# Patient Record
Sex: Female | Born: 1975 | Race: White | Hispanic: No | Smoking: Current every day smoker
Health system: Southern US, Community
[De-identification: ages and names within clinical notes are randomized; demographics above are authoritative.]

## PROBLEM LIST (undated history)

## (undated) DIAGNOSIS — F32A Depression, unspecified: Secondary | ICD-10-CM

## (undated) DIAGNOSIS — R12 Heartburn: Secondary | ICD-10-CM

## (undated) DIAGNOSIS — G4730 Sleep apnea, unspecified: Secondary | ICD-10-CM

## (undated) DIAGNOSIS — R7303 Prediabetes: Secondary | ICD-10-CM

## (undated) DIAGNOSIS — Z22322 Carrier or suspected carrier of Methicillin resistant Staphylococcus aureus: Secondary | ICD-10-CM

## (undated) DIAGNOSIS — F988 Other specified behavioral and emotional disorders with onset usually occurring in childhood and adolescence: Secondary | ICD-10-CM

## (undated) DIAGNOSIS — R51 Headache: Secondary | ICD-10-CM

## (undated) DIAGNOSIS — E559 Vitamin D deficiency, unspecified: Secondary | ICD-10-CM

## (undated) DIAGNOSIS — F419 Anxiety disorder, unspecified: Secondary | ICD-10-CM

## (undated) DIAGNOSIS — E669 Obesity, unspecified: Secondary | ICD-10-CM

## (undated) DIAGNOSIS — F329 Major depressive disorder, single episode, unspecified: Secondary | ICD-10-CM

## (undated) HISTORY — DX: Anxiety disorder, unspecified: F41.9

## (undated) HISTORY — DX: Sleep apnea, unspecified: G47.30

## (undated) HISTORY — PX: OTHER SURGICAL HISTORY: SHX169

## (undated) HISTORY — PX: TUBAL LIGATION: SHX77

## (undated) HISTORY — PX: WISDOM TOOTH EXTRACTION: SHX21

---

## 1997-07-04 ENCOUNTER — Emergency Department (HOSPITAL_COMMUNITY): Admission: EM | Admit: 1997-07-04 | Discharge: 1997-07-04 | Payer: Self-pay | Admitting: Emergency Medicine

## 1997-11-16 ENCOUNTER — Emergency Department (HOSPITAL_COMMUNITY): Admission: EM | Admit: 1997-11-16 | Discharge: 1997-11-16 | Payer: Self-pay | Admitting: Family Medicine

## 1998-08-30 ENCOUNTER — Emergency Department (HOSPITAL_COMMUNITY): Admission: EM | Admit: 1998-08-30 | Discharge: 1998-08-30 | Payer: Self-pay | Admitting: Emergency Medicine

## 1999-01-12 ENCOUNTER — Inpatient Hospital Stay (HOSPITAL_COMMUNITY): Admission: AD | Admit: 1999-01-12 | Discharge: 1999-01-12 | Payer: Self-pay | Admitting: Obstetrics & Gynecology

## 1999-01-12 ENCOUNTER — Encounter: Payer: Self-pay | Admitting: Obstetrics

## 1999-01-26 ENCOUNTER — Inpatient Hospital Stay (HOSPITAL_COMMUNITY): Admission: AD | Admit: 1999-01-26 | Discharge: 1999-01-26 | Payer: Self-pay | Admitting: Obstetrics

## 1999-02-03 ENCOUNTER — Encounter (HOSPITAL_COMMUNITY): Admission: RE | Admit: 1999-02-03 | Discharge: 1999-02-19 | Payer: Self-pay | Admitting: *Deleted

## 1999-02-17 ENCOUNTER — Encounter: Payer: Self-pay | Admitting: *Deleted

## 1999-02-17 ENCOUNTER — Inpatient Hospital Stay (HOSPITAL_COMMUNITY): Admission: AD | Admit: 1999-02-17 | Discharge: 1999-02-20 | Payer: Self-pay | Admitting: *Deleted

## 2000-04-21 ENCOUNTER — Emergency Department (HOSPITAL_COMMUNITY): Admission: EM | Admit: 2000-04-21 | Discharge: 2000-04-21 | Payer: Self-pay

## 2001-10-07 ENCOUNTER — Emergency Department (HOSPITAL_COMMUNITY): Admission: EM | Admit: 2001-10-07 | Discharge: 2001-10-07 | Payer: Self-pay | Admitting: Emergency Medicine

## 2001-11-03 ENCOUNTER — Emergency Department (HOSPITAL_COMMUNITY): Admission: EM | Admit: 2001-11-03 | Discharge: 2001-11-03 | Payer: Self-pay | Admitting: Emergency Medicine

## 2001-11-03 ENCOUNTER — Encounter: Payer: Self-pay | Admitting: Emergency Medicine

## 2002-06-21 ENCOUNTER — Inpatient Hospital Stay (HOSPITAL_COMMUNITY): Admission: AD | Admit: 2002-06-21 | Discharge: 2002-06-21 | Payer: Self-pay | Admitting: Family Medicine

## 2002-12-03 ENCOUNTER — Inpatient Hospital Stay (HOSPITAL_COMMUNITY): Admission: AD | Admit: 2002-12-03 | Discharge: 2002-12-03 | Payer: Self-pay | Admitting: Obstetrics & Gynecology

## 2003-03-18 ENCOUNTER — Emergency Department (HOSPITAL_COMMUNITY): Admission: AD | Admit: 2003-03-18 | Discharge: 2003-03-18 | Payer: Self-pay | Admitting: Family Medicine

## 2003-07-25 ENCOUNTER — Emergency Department (HOSPITAL_COMMUNITY): Admission: EM | Admit: 2003-07-25 | Discharge: 2003-07-25 | Payer: Self-pay | Admitting: Emergency Medicine

## 2003-09-06 ENCOUNTER — Emergency Department (HOSPITAL_COMMUNITY): Admission: EM | Admit: 2003-09-06 | Discharge: 2003-09-06 | Payer: Self-pay | Admitting: Emergency Medicine

## 2003-11-03 ENCOUNTER — Emergency Department (HOSPITAL_COMMUNITY): Admission: EM | Admit: 2003-11-03 | Discharge: 2003-11-03 | Payer: Self-pay | Admitting: Emergency Medicine

## 2004-08-28 ENCOUNTER — Inpatient Hospital Stay (HOSPITAL_COMMUNITY): Admission: AD | Admit: 2004-08-28 | Discharge: 2004-08-28 | Payer: Self-pay | Admitting: Obstetrics & Gynecology

## 2005-05-11 ENCOUNTER — Emergency Department (HOSPITAL_COMMUNITY): Admission: EM | Admit: 2005-05-11 | Discharge: 2005-05-11 | Payer: Self-pay | Admitting: Family Medicine

## 2005-05-31 ENCOUNTER — Inpatient Hospital Stay (HOSPITAL_COMMUNITY): Admission: AD | Admit: 2005-05-31 | Discharge: 2005-05-31 | Payer: Self-pay | Admitting: Family Medicine

## 2005-07-10 ENCOUNTER — Inpatient Hospital Stay (HOSPITAL_COMMUNITY): Admission: AD | Admit: 2005-07-10 | Discharge: 2005-07-10 | Payer: Self-pay | Admitting: Family Medicine

## 2005-07-21 ENCOUNTER — Encounter: Payer: Self-pay | Admitting: Emergency Medicine

## 2005-09-24 ENCOUNTER — Inpatient Hospital Stay (HOSPITAL_COMMUNITY): Admission: AD | Admit: 2005-09-24 | Discharge: 2005-09-24 | Payer: Self-pay | Admitting: Obstetrics & Gynecology

## 2005-11-21 ENCOUNTER — Inpatient Hospital Stay (HOSPITAL_COMMUNITY): Admission: AD | Admit: 2005-11-21 | Discharge: 2005-11-21 | Payer: Self-pay | Admitting: Obstetrics and Gynecology

## 2005-12-02 ENCOUNTER — Ambulatory Visit (HOSPITAL_COMMUNITY): Admission: RE | Admit: 2005-12-02 | Discharge: 2005-12-02 | Payer: Self-pay | Admitting: Obstetrics and Gynecology

## 2006-01-03 ENCOUNTER — Inpatient Hospital Stay (HOSPITAL_COMMUNITY): Admission: RE | Admit: 2006-01-03 | Discharge: 2006-01-03 | Payer: Self-pay | Admitting: Obstetrics and Gynecology

## 2006-01-03 IMAGING — US US OB FOLLOW-UP
1 series · 13 of 28 positions shown · non-contrast
Comparison: none

CLINICAL DATA: Assess growth and presentation.  Size greater than dates.

[Series 1: us ob follow-up · 0.47mm/px · 13 of 32 slices shown]
[im 2/32]
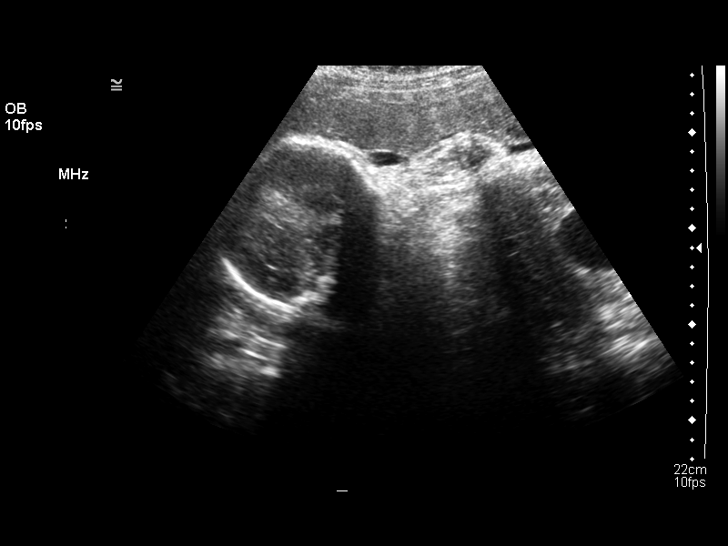
[im 4/32]
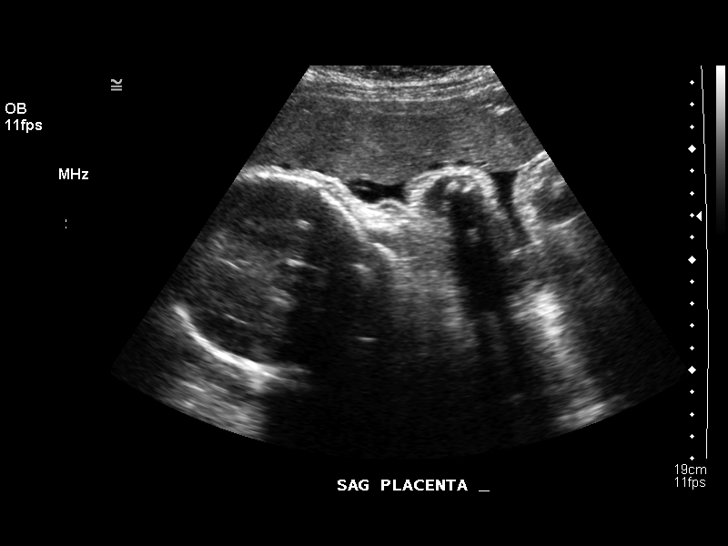
[im 6/32]
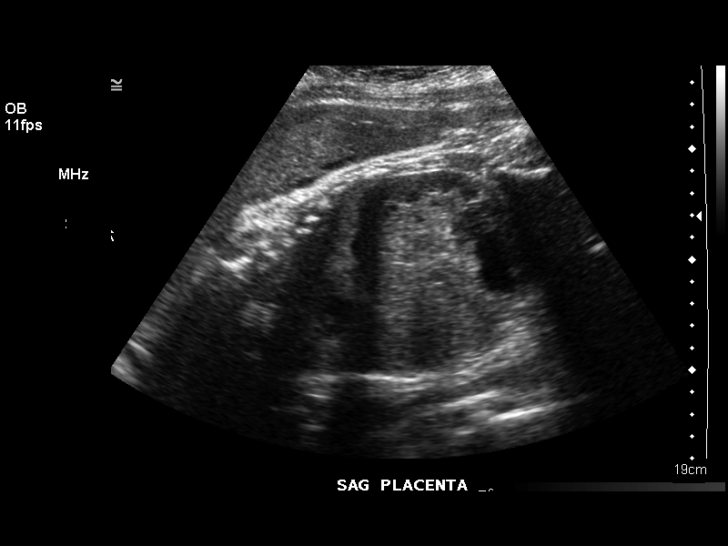
[im 9/32]
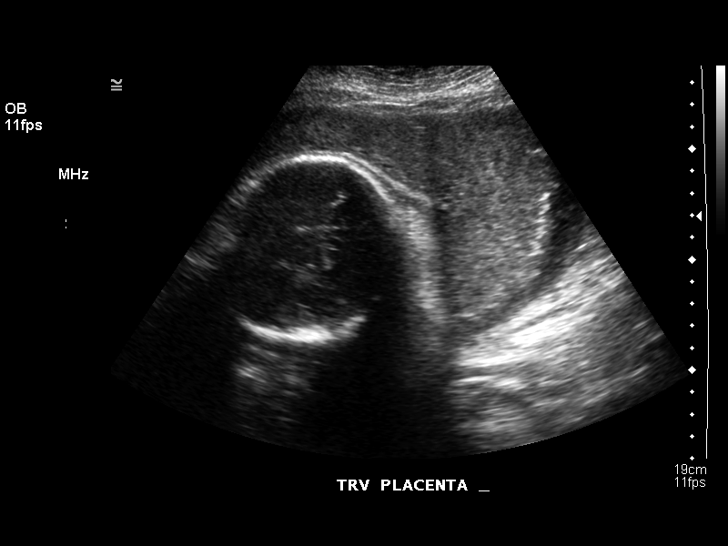
[im 11/32]
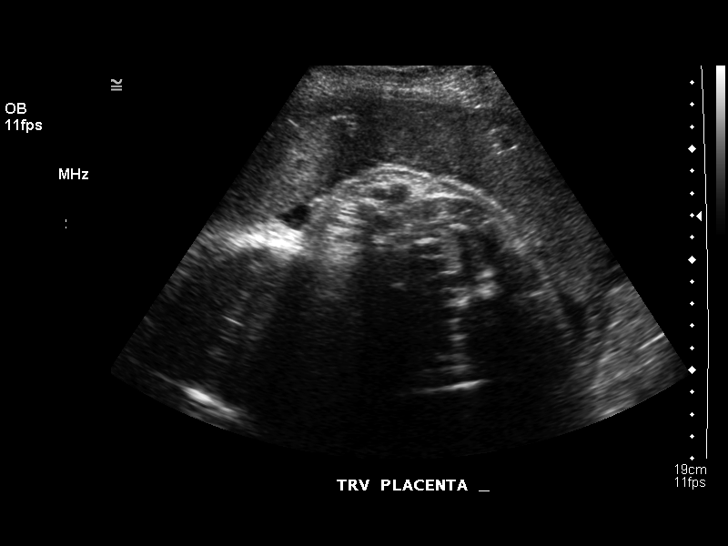
[im 13/32]
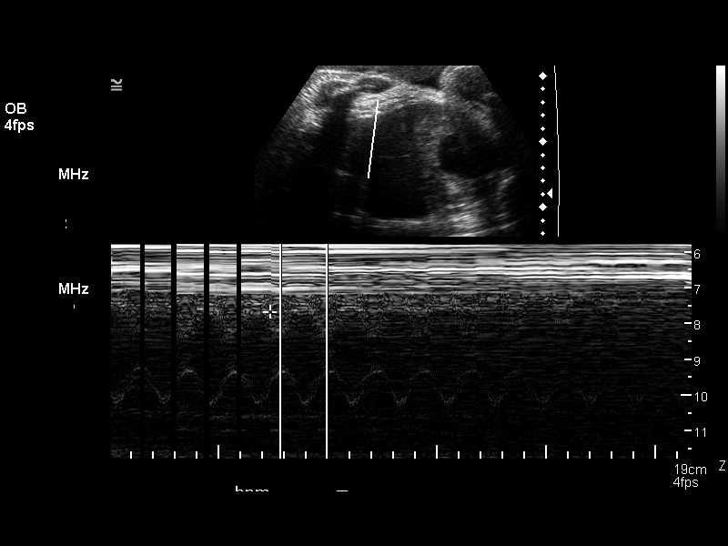
[im 17/32]
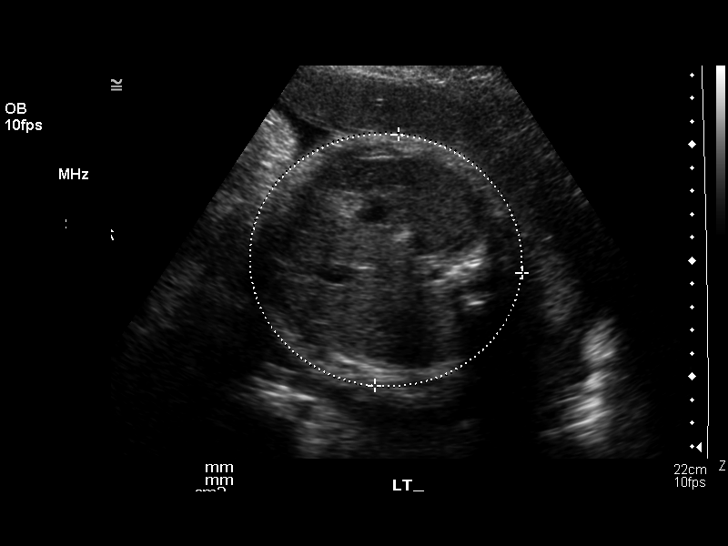
[im 19/32]
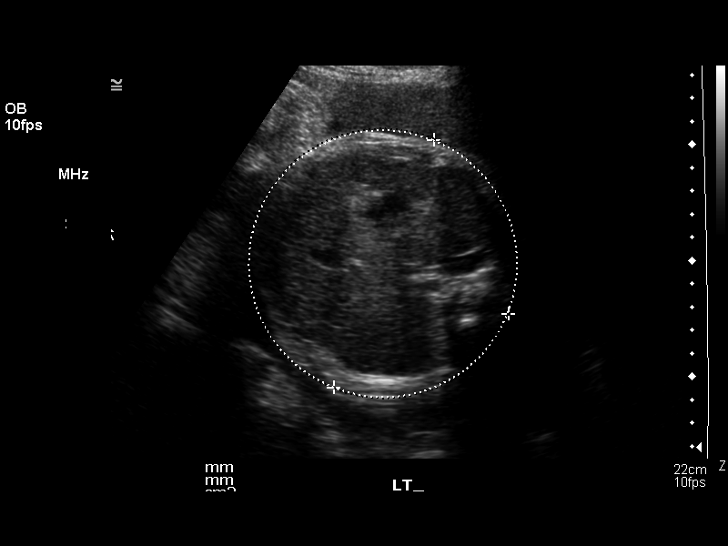
[im 21/32]
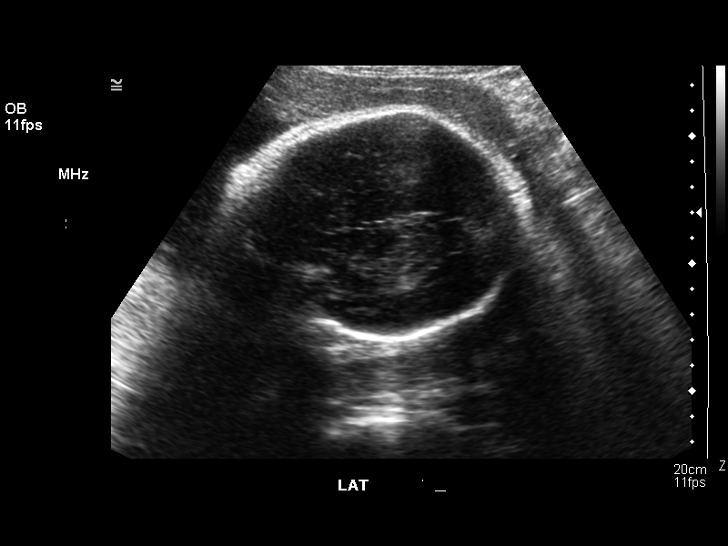
[im 23/32]
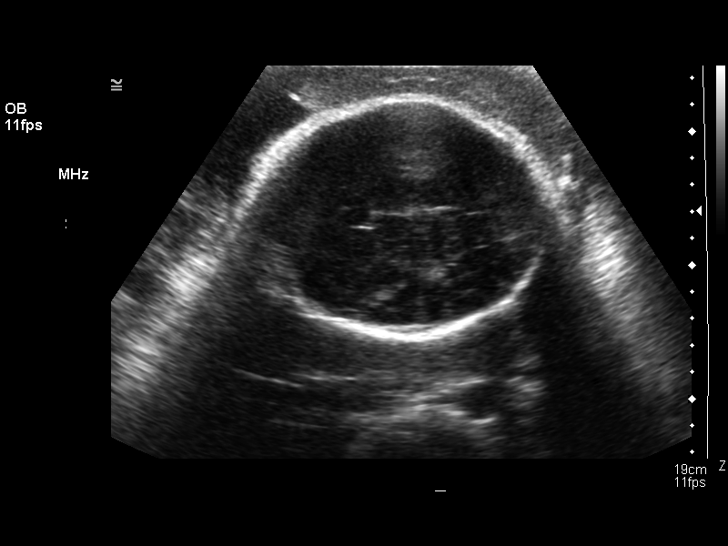
[im 26/32]
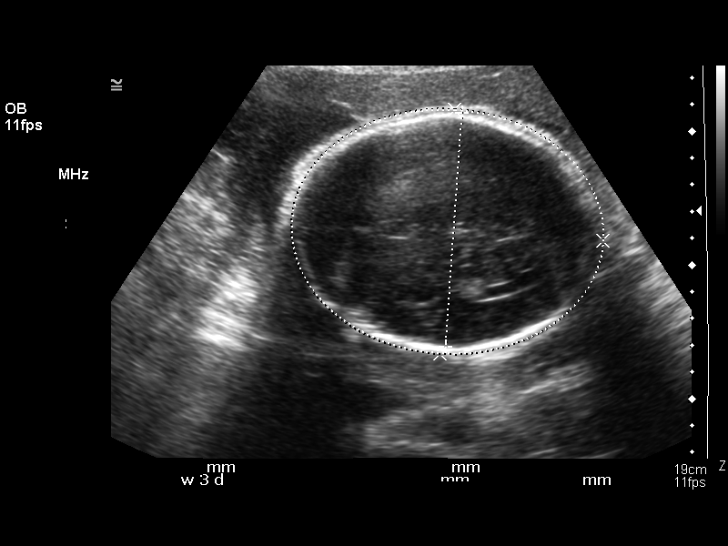
[im 28/32]
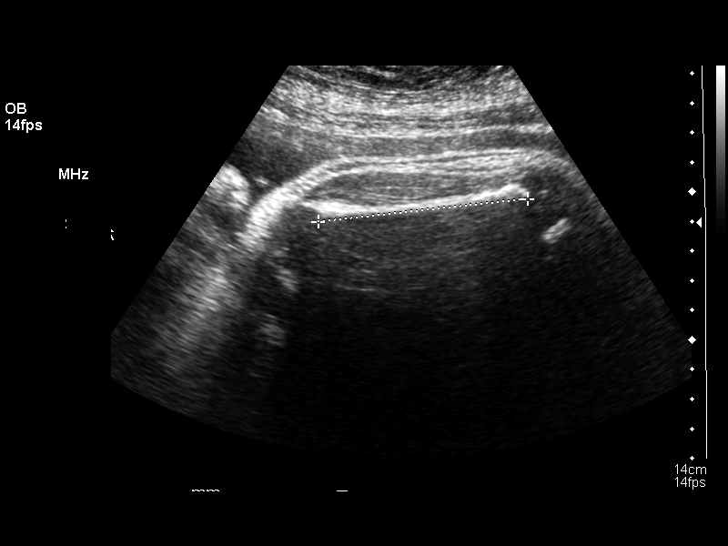
[im 30/32]
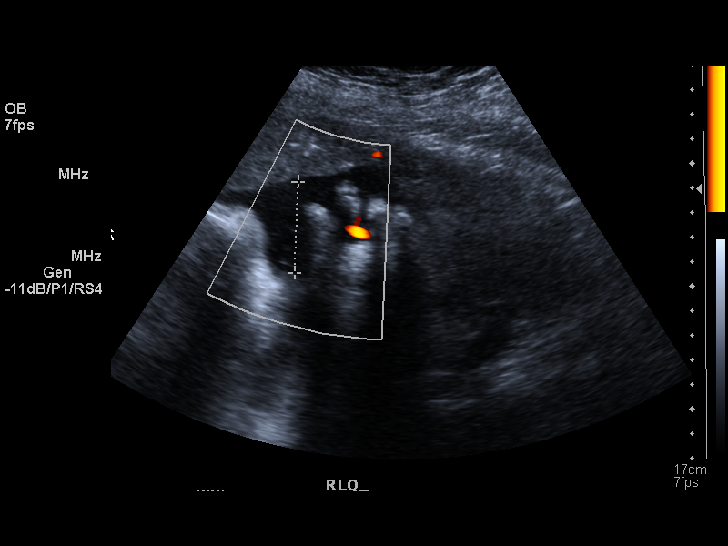

[13 of 28 positions shown; findings below may reference images not displayed]

OBSTETRICAL ULTRASOUND RE-EVALUATION:
Number of Fetuses:  1
Heart Rate:  141 bpm
Movement:  Yes
Breathing:  No
Presentation:  Breech
Placental Location:  Fundal, anterior
Grade:  II
Previa:  No
Amniotic Fluid (subjective):  Normal
Amniotic Fluid (objective):  AFI 16.6 cm ([9J] %ile = 7.5 to 24.4 cm for 37 weeks) 

FETAL BIOMETRY
BPD:  8.8 cm   35 w 3 d 
HC:  32.8 cm   37 w 1 d 
AC:  35.8 cm   39 w 5 d 
FL:  7.1 cm   36 w 2 d 

Mean GA:   37 w 1 d   US EDC:  [DATE]
Assigned GA:  37 w 0 d   Assigned EDC:  [DATE]

EFW:  [9J] grams + / - 510 grams 75th ? 90th %ile ([9J] ? [9J] g) for 37 weeks 

FETAL ANATOMY
Lateral Ventricles:  Visualized 
Thalami/CSP:  Visualized 
Posterior Fossa:  Previously visualized 
Nuchal Region:  Previously visualized 
Spine:  Previously visualized 
4 Chamber Heart on Left:  Previously visualized 
Stomach on Left:  Visualized 
3 Vessel Cord:  Previously visualized 
Cord Insertion Site:  Previously visualized 
Kidneys:  Visualized 
Bladder:  Visualized 
Extremities:  Previously visualized 

ADDITIONAL ANATOMY VISUALIZED:  LVOT, RVOT, upper lip, orbits, diaphragm, heel, ductal arch, and aortic arch.

Evaluation limited by:  Advanced gestational age.  

MATERNAL UTERINE AND ADNEXAL FINDINGS
Cervix:  Not evaluated; >34 weeks.
IMPRESSION: 1.  Single intrauterine pregnancy demonstrating an estimated gestational age by ultrasound of 37 weeks 1 day.  The abdominal circumference is larger than the remaining gestational indicators with an absolute measurement of 35.8 cm (39 weeks 5 days).  This is 2 weeks 2 days ahead of assigned gestational age of 37 weeks 0 days.  Currently the estimated fetal weight is between the [9J] percentile for a 37 week gestation.  This represents an interval decrease in overall estimated fetal weight percentile since the previous exam performed at 32 weeks, at which time the patient was between the [9J] percentile.  
2.  Subjectively and quantitatively normal amniotic fluid volume.  
3.  Current complete breech presentation.
4.  No late developing fetal anatomic abnormalities are seen associated with the lateral ventricles, stomach, kidneys, or bladder.  A 4-chamber heart view could not be seen with clarity due to positioning on today?s exam.  The fetal profile and 5th digit, which have not been seen previously, could not be visualized today due to positioning.

## 2006-01-16 ENCOUNTER — Ambulatory Visit: Payer: Self-pay | Admitting: Obstetrics and Gynecology

## 2006-01-16 ENCOUNTER — Inpatient Hospital Stay (HOSPITAL_COMMUNITY): Admission: AD | Admit: 2006-01-16 | Discharge: 2006-01-18 | Payer: Self-pay | Admitting: Obstetrics and Gynecology

## 2006-01-16 ENCOUNTER — Encounter (INDEPENDENT_AMBULATORY_CARE_PROVIDER_SITE_OTHER): Payer: Self-pay | Admitting: *Deleted

## 2006-03-09 ENCOUNTER — Emergency Department (HOSPITAL_COMMUNITY): Admission: EM | Admit: 2006-03-09 | Discharge: 2006-03-09 | Payer: Self-pay | Admitting: Emergency Medicine

## 2006-04-02 ENCOUNTER — Emergency Department (HOSPITAL_COMMUNITY): Admission: EM | Admit: 2006-04-02 | Discharge: 2006-04-02 | Payer: Self-pay | Admitting: Family Medicine

## 2008-06-08 ENCOUNTER — Emergency Department (HOSPITAL_COMMUNITY): Admission: EM | Admit: 2008-06-08 | Discharge: 2008-06-08 | Payer: Self-pay | Admitting: Emergency Medicine

## 2008-07-05 ENCOUNTER — Emergency Department (HOSPITAL_COMMUNITY): Admission: EM | Admit: 2008-07-05 | Discharge: 2008-07-05 | Payer: Self-pay | Admitting: Emergency Medicine

## 2009-04-16 ENCOUNTER — Inpatient Hospital Stay (HOSPITAL_COMMUNITY): Admission: AD | Admit: 2009-04-16 | Discharge: 2009-04-16 | Payer: Self-pay | Admitting: Obstetrics & Gynecology

## 2009-06-13 ENCOUNTER — Ambulatory Visit (HOSPITAL_COMMUNITY): Admission: RE | Admit: 2009-06-13 | Discharge: 2009-06-13 | Payer: Self-pay | Admitting: Obstetrics & Gynecology

## 2009-07-11 ENCOUNTER — Ambulatory Visit: Payer: Self-pay | Admitting: Obstetrics and Gynecology

## 2009-07-11 ENCOUNTER — Inpatient Hospital Stay (HOSPITAL_COMMUNITY): Admission: AD | Admit: 2009-07-11 | Discharge: 2009-07-11 | Payer: Self-pay | Admitting: Obstetrics

## 2009-08-05 ENCOUNTER — Ambulatory Visit (HOSPITAL_COMMUNITY): Admission: RE | Admit: 2009-08-05 | Discharge: 2009-08-05 | Payer: Self-pay | Admitting: Obstetrics & Gynecology

## 2009-08-27 ENCOUNTER — Ambulatory Visit (HOSPITAL_BASED_OUTPATIENT_CLINIC_OR_DEPARTMENT_OTHER): Admission: RE | Admit: 2009-08-27 | Discharge: 2009-08-27 | Payer: Self-pay | Admitting: Obstetrics & Gynecology

## 2009-08-30 ENCOUNTER — Ambulatory Visit: Payer: Self-pay | Admitting: Internal Medicine

## 2009-09-23 ENCOUNTER — Ambulatory Visit (HOSPITAL_COMMUNITY): Admission: RE | Admit: 2009-09-23 | Discharge: 2009-09-23 | Payer: Self-pay | Admitting: Obstetrics & Gynecology

## 2009-09-30 ENCOUNTER — Inpatient Hospital Stay (HOSPITAL_COMMUNITY): Admission: RE | Admit: 2009-09-30 | Discharge: 2009-10-03 | Payer: Self-pay | Admitting: Obstetrics & Gynecology

## 2009-10-01 ENCOUNTER — Encounter: Payer: Self-pay | Admitting: Obstetrics & Gynecology

## 2010-04-12 ENCOUNTER — Encounter: Payer: Self-pay | Admitting: Obstetrics & Gynecology

## 2010-06-07 LAB — CBC
HCT: 32.5 % — ABNORMAL LOW (ref 36.0–46.0)
HCT: 34.8 % — ABNORMAL LOW (ref 36.0–46.0)
Hemoglobin: 11 g/dL — ABNORMAL LOW (ref 12.0–15.0)
Hemoglobin: 11.9 g/dL — ABNORMAL LOW (ref 12.0–15.0)
MCH: 29.5 pg (ref 26.0–34.0)
MCHC: 34 g/dL (ref 30.0–36.0)
MCV: 86.1 fL (ref 78.0–100.0)
RDW: 14.1 % (ref 11.5–15.5)
WBC: 16.7 10*3/uL — ABNORMAL HIGH (ref 4.0–10.5)
WBC: 16.9 10*3/uL — ABNORMAL HIGH (ref 4.0–10.5)

## 2010-06-07 LAB — URINALYSIS, ROUTINE W REFLEX MICROSCOPIC
Bilirubin Urine: NEGATIVE
Nitrite: NEGATIVE
Protein, ur: NEGATIVE mg/dL

## 2010-06-07 LAB — WET PREP, GENITAL: Clue Cells Wet Prep HPF POC: NONE SEEN

## 2010-06-09 LAB — URINALYSIS, ROUTINE W REFLEX MICROSCOPIC
Glucose, UA: NEGATIVE mg/dL
Ketones, ur: NEGATIVE mg/dL
Specific Gravity, Urine: 1.01 (ref 1.005–1.030)
Urobilinogen, UA: 0.2 mg/dL (ref 0.0–1.0)
pH: 7 (ref 5.0–8.0)

## 2010-06-09 LAB — COMPREHENSIVE METABOLIC PANEL
Albumin: 3 g/dL — ABNORMAL LOW (ref 3.5–5.2)
Alkaline Phosphatase: 68 U/L (ref 39–117)
Calcium: 8.5 mg/dL (ref 8.4–10.5)
Creatinine, Ser: 0.46 mg/dL (ref 0.4–1.2)
GFR calc Af Amer: >60 mL/min (ref >60)
GFR calc non Af Amer: >60 mL/min (ref >60)
Glucose, Bld: 77 mg/dL (ref 70–99)
Sodium: 134 mEq/L — ABNORMAL LOW (ref 135–145)
Total Bilirubin: 0.7 mg/dL (ref 0.3–1.2)

## 2010-06-09 LAB — WET PREP, GENITAL
Clue Cells Wet Prep HPF POC: NONE SEEN
Trich, Wet Prep: NONE SEEN

## 2010-06-09 LAB — CBC
HCT: 31.8 % — ABNORMAL LOW (ref 36.0–46.0)
Hemoglobin: 10.9 g/dL — ABNORMAL LOW (ref 12.0–15.0)
MCV: 88.9 fL (ref 78.0–100.0)
RDW: 13.3 % (ref 11.5–15.5)

## 2010-06-11 LAB — GC/CHLAMYDIA PROBE AMP, GENITAL: Chlamydia: NEGATIVE

## 2010-06-11 LAB — ANTIBODY SCREEN: Antibody Screen: NEGATIVE

## 2010-07-25 ENCOUNTER — Emergency Department (HOSPITAL_COMMUNITY)
Admission: EM | Admit: 2010-07-25 | Discharge: 2010-07-25 | Disposition: A | Discharge status: Home / Self Care | Payer: Medicaid Other | Attending: Emergency Medicine | Admitting: Emergency Medicine

## 2010-07-25 DIAGNOSIS — H5710 Ocular pain, unspecified eye: Secondary | ICD-10-CM | POA: Insufficient documentation

## 2010-07-25 DIAGNOSIS — H109 Unspecified conjunctivitis: Secondary | ICD-10-CM | POA: Insufficient documentation

## 2010-07-25 DIAGNOSIS — F17200 Nicotine dependence, unspecified, uncomplicated: Secondary | ICD-10-CM | POA: Insufficient documentation

## 2010-08-07 NOTE — Discharge Summary (Signed)
NAME:  MICHELINE, MARKES NO.:  [id number]   MEDICAL RECORD NO.:  [id number]          PATIENT TYPE:  INP   LOCATION:  9104                          FACILITY:  [facility]   PHYSICIAN:  Alanda Amass, M.D.   DATE OF BIRTH:  December 08, 1975   DATE OF ADMISSION:  01/16/2006  DATE OF DISCHARGE:  01/18/2006                                 DISCHARGE SUMMARY   ADMISSION DIAGNOSES:  1. Gravida 2, para 1, 0, 0, 1 at 38-5/7 weeks intrauterine pregnancy.  2. Breech presentation. History of failed version.  3. Knee and foot presentation with spontaneous rupture of membranes and      complete dilation.  4. Pediculus capitis (also known as lice).   DISCHARGE DIAGNOSES:  1. Term delivery of a viable female infant.  2. Pediculus capitis.  3. Breech delivery, a singleton footling breech extraction.   HOSPITAL COURSE:  The patient is a 35 year old G2, P1, 0, 0, 1, at 38-5/7  weeks who was admitted because of spontaneous rupture of membranes, breech  presentation and a knee and foot presentation and complete dilation on exam.  The patient was emergently taken to the OR for a section. She was given a  spinal. She was prepped for a C-section but ended up undergoing a singleton  footling breech extraction by Dr. Okey Dupre and a viable female infant was  delivered. Apgar's 7 at 1 minute, 9 at 5 minutes.   The patient had a routine postoperative course. She was treated for her lice  with Permethrin shampoo x2. Her hair was also combed but with an ordinary  comb, not with a nit comb (which was discovered later). She is breast  feeding her baby. The infant did have a circumcision done here. She will be  taking Micronor for contraception. She knows to shampoo her hair another  time with Permethrin shampoo in 7-10 days and to wash all her linens and  clothes.   PRENATAL LABS:  She is O positive, antibody negative. Rubella immune.  Hemoglobin 11.9, platelets 198,000. Hepatitis B surface antigen negative.  Syphilis nonreactive. Gonorrhea and Chlamydia negative.  GBS negative.  Labs  in the hospital include a postoperative CBC reveals a WBC of 16,600,  hemoglobin 11.6, hematocrit 33.7, platelets 202,000. (A preoperative CBC:  WBC 17,400, hemoglobin 13.0, hematocrit 37.7 and platelets 221,000. RPR  nonreactive).   DISCHARGE MEDICATIONS:  1. Prenatal vitamin daily.  2. Permethrin 1% shampoo to use in 7-10 days.  3. Micronor 1 tablet daily at the same time each day, starting 2 Sundays      after discharge.  4. Ibuprofen 600 mg q.6 hours p.r.n. pain.   DISCHARGE INSTRUCTIONS:  The patient is to have pelvic rest x6 weeks. She is  to follow up with [facility] in 6 weeks for a postpartum appointment. She  is to buy a nit comb and comb her hair to get rid of the lice  eggs. She is also to repeat a Permethrin shampoo in 7-10 days should she  still have live lice. She is to wash all her bed linens, towels as well as  any clothes.   Discharge condition is stable and good.           ______________________________  Alanda Amass, M.D.     JH/MEDQ  D:  01/19/2006  T:  01/19/2006  Job:  [id number]

## 2010-08-07 NOTE — Op Note (Signed)
NAME:  [name], [name] NO.:  [id number]   MEDICAL RECORD NO.:  [id number]          PATIENT TYPE:  INP   LOCATION:  9104                          FACILITY:  [facility]   PHYSICIAN:  Phil D. Rose, M.D.     DATE OF BIRTH:  June 19, 1975   DATE OF PROCEDURE:  01/16/2006  DATE OF DISCHARGE:                                 OPERATIVE REPORT   The patient is a gravida 2, para 1-0-0-1 with expected date of delivery of  January 24, 2006, who was admitted fully dilated with a single footling,  i.e. right breech presentation and the foot at the outlet.  The patient had  been prepared for cesarean section, a 300 pound patient.  Spinal given and I  saw the patient, we decided that since she was a multip, that she did not  look like a much bigger baby than her last, to go ahead and try a breech  extraction.  The left leg was grasped by the foot between the index and  middle finger and brought down until buttocks was delivered.  The left leg  was then easily delivered.  Patient brought down.  She was able to push a  bit and both shoulders were delivered and the patient's head was delivered  by index finger in the mouth.  Cord doubly clamped.  Baby handed to the  pediatrician. Samples of blood taken from the cord for evaluation and the  placenta spontaneously expelled.  There were no lacerations noted.   ESTIMATED BLOOD LOSS:  300 mL.           ______________________________  Javier Glazier. Okey Dupre, M.D.     PDR/MEDQ  D:  01/16/2006  T:  01/17/2006  Job:  [id number]

## 2010-08-16 LAB — ABO/RH: RH Type: POSITIVE

## 2010-08-16 LAB — RPR: RPR: NONREACTIVE

## 2010-08-16 LAB — HEPATITIS B SURFACE ANTIGEN: Hepatitis B Surface Ag: NEGATIVE

## 2010-08-18 ENCOUNTER — Emergency Department (HOSPITAL_COMMUNITY): Payer: No Typology Code available for payment source

## 2010-08-18 ENCOUNTER — Emergency Department (HOSPITAL_COMMUNITY)
Admission: EM | Admit: 2010-08-18 | Discharge: 2010-08-18 | Disposition: A | Discharge status: Home / Self Care | Payer: No Typology Code available for payment source | Attending: Emergency Medicine | Admitting: Emergency Medicine

## 2010-08-18 DIAGNOSIS — IMO0002 Reserved for concepts with insufficient information to code with codable children: Secondary | ICD-10-CM | POA: Insufficient documentation

## 2010-08-18 DIAGNOSIS — M25519 Pain in unspecified shoulder: Secondary | ICD-10-CM | POA: Insufficient documentation

## 2010-08-18 DIAGNOSIS — S40019A Contusion of unspecified shoulder, initial encounter: Secondary | ICD-10-CM | POA: Insufficient documentation

## 2010-08-18 DIAGNOSIS — R51 Headache: Secondary | ICD-10-CM | POA: Insufficient documentation

## 2010-08-31 ENCOUNTER — Inpatient Hospital Stay (HOSPITAL_COMMUNITY)
Admission: AD | Admit: 2010-08-31 | Discharge: 2010-08-31 | Disposition: A | Discharge status: Home / Self Care | Payer: Medicaid Other | Source: Ambulatory Visit | Attending: Obstetrics & Gynecology | Admitting: Obstetrics & Gynecology

## 2010-08-31 DIAGNOSIS — O99891 Other specified diseases and conditions complicating pregnancy: Secondary | ICD-10-CM | POA: Insufficient documentation

## 2010-10-30 ENCOUNTER — Emergency Department (HOSPITAL_COMMUNITY)
Admission: EM | Admit: 2010-10-30 | Discharge: 2010-10-30 | Disposition: A | Discharge status: Home / Self Care | Payer: Medicaid Other | Attending: Emergency Medicine | Admitting: Emergency Medicine

## 2010-10-30 DIAGNOSIS — H60399 Other infective otitis externa, unspecified ear: Secondary | ICD-10-CM | POA: Insufficient documentation

## 2010-10-30 DIAGNOSIS — O99891 Other specified diseases and conditions complicating pregnancy: Secondary | ICD-10-CM | POA: Insufficient documentation

## 2010-10-30 DIAGNOSIS — H9209 Otalgia, unspecified ear: Secondary | ICD-10-CM | POA: Insufficient documentation

## 2011-02-08 ENCOUNTER — Emergency Department (HOSPITAL_COMMUNITY): Payer: Self-pay

## 2011-02-08 ENCOUNTER — Encounter: Payer: Self-pay | Admitting: *Deleted

## 2011-02-08 ENCOUNTER — Emergency Department (HOSPITAL_COMMUNITY)
Admission: EM | Admit: 2011-02-08 | Discharge: 2011-02-08 | Disposition: A | Discharge status: Home / Self Care | Payer: Self-pay | Attending: Emergency Medicine | Admitting: Emergency Medicine

## 2011-02-08 DIAGNOSIS — S92253A Displaced fracture of navicular [scaphoid] of unspecified foot, initial encounter for closed fracture: Secondary | ICD-10-CM | POA: Insufficient documentation

## 2011-02-08 DIAGNOSIS — W19XXXA Unspecified fall, initial encounter: Secondary | ICD-10-CM

## 2011-02-08 DIAGNOSIS — M25579 Pain in unspecified ankle and joints of unspecified foot: Secondary | ICD-10-CM | POA: Insufficient documentation

## 2011-02-08 DIAGNOSIS — O99891 Other specified diseases and conditions complicating pregnancy: Secondary | ICD-10-CM | POA: Insufficient documentation

## 2011-02-08 DIAGNOSIS — S92251A Displaced fracture of navicular [scaphoid] of right foot, initial encounter for closed fracture: Secondary | ICD-10-CM

## 2011-02-08 MED ORDER — HYDROCODONE-ACETAMINOPHEN 7.5-500 MG/15ML PO SOLN
10.0000 mL | Freq: Once | ORAL | Status: DC
  Filled 2011-02-08: qty 15

## 2011-02-08 MED ORDER — HYDROCODONE-ACETAMINOPHEN 5-500 MG PO TABS: 1.0000 | ORAL_TABLET | Freq: Four times a day (QID) | ORAL | Status: AC | PRN

## 2011-02-08 MED ORDER — HYDROCODONE-ACETAMINOPHEN 5-325 MG PO TABS
1.0000 | ORAL_TABLET | Freq: Once | ORAL | Status: AC
  Administered 2011-02-08: 1 via ORAL
  Filled 2011-02-08: qty 1

## 2011-02-08 NOTE — ED Provider Notes (Signed)
History     CSN: [id number] Arrival date & time: 02/08/2011  3:18 PM   First MD Initiated Contact with Patient 02/08/11 1812      Chief Complaint  Patient presents with  . Ankle Pain    injury/ possibly broken    (Consider location/radiation/quality/duration/timing/severity/associated sxs/prior treatment) Patient is a 35 y.o. female presenting with ankle pain. The history is provided by the patient.  Ankle Pain  The incident occurred 3 to 5 hours ago. The injury mechanism was a fall. The pain is present in the right ankle. The quality of the pain is described as aching. The pain is at a severity of 6/10. The pain is moderate. The pain has been constant since onset. Associated symptoms include inability to bear weight. Pertinent negatives include no numbness, no muscle weakness, no loss of sensation and no tingling. The symptoms are aggravated by activity and bearing weight.  Pt states she is 8mon pregnant. Was walking out of a bus, missed a step, fell twisting right ankle. Stats fell onto right side/abdomen. Reports pain to right ankle. Denies abdominal pain, vagina discharge/bleeding. Feels baby moving.  History reviewed. No pertinent past medical history.  History reviewed. No pertinent past surgical history.  Family History  Problem Relation Age of Onset  . Diabetes Mother   . Hyperlipidemia Mother     History  Substance Use Topics  . Smoking status: Former Games developer  . Smokeless tobacco: Not on file  . Alcohol Use: No    OB History    Grav Para Term Preterm Abortions TAB SAB Ect Mult Living   1               Review of Systems  Constitutional: Negative.   HENT: Negative.   Eyes: Negative.   Respiratory: Negative.   Cardiovascular: Negative.   Gastrointestinal: Negative for nausea, vomiting and abdominal pain.  Genitourinary: Negative.  Negative for vaginal bleeding, vaginal discharge and pelvic pain.  Musculoskeletal: Positive for joint swelling and gait problem.    Skin: Negative.   Neurological: Negative.  Negative for tingling and numbness.  Psychiatric/Behavioral: Negative.     Allergies  Review of patient's allergies indicates no known allergies.  Home Medications   Current Outpatient Rx  Name Route Sig Dispense Refill  . ACETAMINOPHEN 100 MG/ML PO SOLN      . ACETAMINOPHEN 325 MG PO TABS Oral Take 650 mg by mouth every 6 (six) hours as needed.      Marland Kitchen PRENATAL 27-0.8 MG PO TABS Oral Take by mouth.       BP 111/58  Pulse 91  Temp(Src) 97.6 F (36.4 C) (Oral)  Resp 16  Ht 5\' 8"  (1.727 m)  Wt 285 lb (129.275 kg)  BMI 43.33 kg/m2  SpO2 99%  Physical Exam  Constitutional: She is oriented to person, place, and time. She appears well-developed and well-nourished. No distress.  HENT:  Head: Normocephalic and atraumatic.  Eyes: Pupils are equal, round, and reactive to light.  Neck: Neck supple.  Cardiovascular: Normal rate, regular rhythm and normal heart sounds.   Pulmonary/Chest: Effort normal and breath sounds normal.  Abdominal: Soft. Bowel sounds are normal. There is no tenderness. There is no guarding.       Abdomen gravid, non tender, no bruising  Musculoskeletal: She exhibits no edema.       Right ankle swelling noted. Pain with palpation over medial malleolus and dorsal foot over 3rd and 4th metatarsals. Pt able to move all toes, however, painful. Pain  with ankle dorsiflexion, plantarflexion, inversion, eversion. Normal knee joint  Neurological: She is alert and oriented to person, place, and time.  Skin: Skin is warm and dry.  Psychiatric: She has a normal mood and affect.    ED Course  Procedures (including critical care time)  Labs Reviewed - No data to display Dg Ankle Complete Right  02/08/2011  *RADIOLOGY REPORT*  Clinical Data: Fall, pain  RIGHT ANKLE - COMPLETE 3+ VIEW  Comparison: None.  Findings: Mild soft tissue swelling.  Intact malleoli, talus and calcaneus.  No displaced fracture.  Preserved joint spaces.   Mild soft tissue swelling.  IMPRESSION: Mild soft tissue swelling.  No acute finding.  Original Report Authenticated By: Judie Petit. Ruel Favors, M.D.   Dg Foot Complete Right  02/08/2011  *RADIOLOGY REPORT*  Clinical Data: Fall, right foot ankle pain  RIGHT FOOT COMPLETE - 3+ VIEW  Comparison: 02/08/2011  Findings: On the frontal view, there is a subtle slightly curved lucency through the navicular medially suspicious for nondisplaced fracture.  Mild soft tissue swelling in this region. Recommend correlation for point tenderness over the navicular bone. Preserved joint spaces.  No other acute osseous finding.  No radiographic foreign body.  IMPRESSION: Findings suspicious for a subtle nondisplaced fracture of the navicular bone medially.  Original Report Authenticated By: Judie Petit. Ruel Favors, M.D.     No diagnosis found.  Pt splinted, crutches provided. Paced on fetal monitor. Pt is doing well, was monitored for 30 min, with good fetal HR. Pt continues to deny abdominal pain. WIll d/c home with GYN and Ortho follow up.   MDM          Lottie Mussel, PA 02/08/11 1952

## 2011-02-08 NOTE — ED Notes (Signed)
Was stepping off the bus and twisted her right ankle. States her ankle and whole foot hurts. Ice applied and foot elevated.

## 2011-02-08 NOTE — ED Notes (Signed)
Patient given discharge instructions, information, prescriptions, and diet order. Patient states that they adequately understand discharge information given and to return to ED if symptoms return or worsen.     

## 2011-02-08 NOTE — ED Notes (Signed)
Pt states she was stepping of the bus and fell. Pt states after her fall she started to have right ankle pain with swelling

## 2011-02-08 NOTE — ED Notes (Signed)
Pt is pregnant. Pt states she landed on her right side when she fell off the bus. Pt denies any loc

## 2011-02-08 NOTE — ED Notes (Signed)
Family requested food, saltine crackers were given

## 2011-02-09 NOTE — ED Provider Notes (Addendum)
Medical screening examination/treatment/procedure(s) were performed by non-physician practitioner and as supervising physician I was immediately available for consultation/collaboration.   Lyanne Co, MD 02/09/11 9604        Lyanne Co, MD 02/09/11 [phone]

## 2011-03-18 ENCOUNTER — Ambulatory Visit (HOSPITAL_COMMUNITY)
Admission: RE | Admit: 2011-03-18 | Discharge: 2011-03-18 | Disposition: A | Discharge status: Home / Self Care | Payer: Self-pay | Source: Ambulatory Visit | Attending: Obstetrics & Gynecology | Admitting: Obstetrics & Gynecology

## 2011-03-18 ENCOUNTER — Other Ambulatory Visit: Payer: Self-pay | Admitting: Obstetrics & Gynecology

## 2011-03-18 DIAGNOSIS — O288 Other abnormal findings on antenatal screening of mother: Secondary | ICD-10-CM

## 2011-03-18 DIAGNOSIS — Z3689 Encounter for other specified antenatal screening: Secondary | ICD-10-CM | POA: Insufficient documentation

## 2011-03-18 DIAGNOSIS — O4100X0 Oligohydramnios, unspecified trimester, not applicable or unspecified: Secondary | ICD-10-CM | POA: Insufficient documentation

## 2011-03-22 LAB — STREP B DNA PROBE: GBS: NEGATIVE

## 2011-03-23 NOTE — L&D Delivery Note (Signed)
Delivery Note At  a viable unspecified sex was delivered via  (Presentation: Occiput anterior, compound presentation;  ).  Nuchal x 2 --manually reduced.   Placenta status: intact, 3VC .    Anesthesia:  Epidural Episiotomy: None Lacerations: None Suture Repair: n/a Est. Blood Loss (mL): 300 ml   Mom to postpartum.  Baby to nursery-stable.  [name],[name] 04/11/2011, 6:32 AM

## 2011-04-09 ENCOUNTER — Encounter (HOSPITAL_COMMUNITY): Payer: Self-pay | Admitting: *Deleted

## 2011-04-09 ENCOUNTER — Inpatient Hospital Stay (HOSPITAL_COMMUNITY)
Admission: AD | Admit: 2011-04-09 | Discharge: 2011-04-13 | DRG: 775 | Disposition: A | Discharge status: Home / Self Care | Payer: Medicaid Other | Source: Ambulatory Visit | Attending: Obstetrics & Gynecology | Admitting: Obstetrics & Gynecology

## 2011-04-09 ENCOUNTER — Inpatient Hospital Stay (HOSPITAL_COMMUNITY): Payer: Medicaid Other

## 2011-04-09 DIAGNOSIS — O99214 Obesity complicating childbirth: Secondary | ICD-10-CM | POA: Diagnosis present

## 2011-04-09 DIAGNOSIS — E669 Obesity, unspecified: Secondary | ICD-10-CM | POA: Diagnosis present

## 2011-04-09 DIAGNOSIS — O328XX0 Maternal care for other malpresentation of fetus, not applicable or unspecified: Secondary | ICD-10-CM | POA: Diagnosis present

## 2011-04-09 DIAGNOSIS — O4100X0 Oligohydramnios, unspecified trimester, not applicable or unspecified: Principal | ICD-10-CM | POA: Diagnosis present

## 2011-04-09 HISTORY — DX: Obesity, unspecified: E66.9

## 2011-04-09 NOTE — Progress Notes (Signed)
C/o R flank pain that started 2 hours ago- pt thinks she maybe  In labor;

## 2011-04-09 NOTE — ED Notes (Signed)
Radiology notified RN that AFI is 2.5;

## 2011-04-10 ENCOUNTER — Encounter (HOSPITAL_COMMUNITY): Payer: Self-pay | Admitting: Anesthesiology

## 2011-04-10 ENCOUNTER — Inpatient Hospital Stay (HOSPITAL_COMMUNITY): Payer: Medicaid Other | Admitting: Anesthesiology

## 2011-04-10 ENCOUNTER — Encounter (HOSPITAL_COMMUNITY): Payer: Self-pay

## 2011-04-10 DIAGNOSIS — O4100X0 Oligohydramnios, unspecified trimester, not applicable or unspecified: Secondary | ICD-10-CM | POA: Diagnosis present

## 2011-04-10 LAB — CBC
HCT: 32.4 % — ABNORMAL LOW (ref 36.0–46.0)
Hemoglobin: 10.6 g/dL — ABNORMAL LOW (ref 12.0–15.0)
RDW: 14.7 % (ref 11.5–15.5)
WBC: 12.6 10*3/uL — ABNORMAL HIGH (ref 4.0–10.5)

## 2011-04-10 MED ORDER — OXYTOCIN 20 UNITS IN LACTATED RINGERS INFUSION - SIMPLE
125.0000 mL/h | Freq: Once | INTRAVENOUS | Status: AC
  Administered 2011-04-11: 125 mL/h via INTRAVENOUS

## 2011-04-10 MED ORDER — IBUPROFEN 600 MG PO TABS
600.0000 mg | ORAL_TABLET | Freq: Four times a day (QID) | ORAL | Status: DC | PRN
  Administered 2011-04-11: 600 mg via ORAL
  Filled 2011-04-10: qty 1

## 2011-04-10 MED ORDER — LACTATED RINGERS IV SOLN
500.0000 mL | INTRAVENOUS | Status: DC | PRN
  Administered 2011-04-10: 500 mL via INTRAVENOUS

## 2011-04-10 MED ORDER — PHENYLEPHRINE 40 MCG/ML (10ML) SYRINGE FOR IV PUSH (FOR BLOOD PRESSURE SUPPORT): 80.0000 ug | PREFILLED_SYRINGE | INTRAVENOUS | Status: DC | PRN

## 2011-04-10 MED ORDER — OXYTOCIN 20 UNITS IN LACTATED RINGERS INFUSION - SIMPLE
INTRAVENOUS | Status: AC
  Administered 2011-04-10: 2 m[IU]/min via INTRAVENOUS
  Filled 2011-04-10: qty 1000

## 2011-04-10 MED ORDER — OXYTOCIN 20 UNITS IN LACTATED RINGERS INFUSION - SIMPLE: 1.0000 m[IU]/min | INTRAVENOUS | Status: DC

## 2011-04-10 MED ORDER — CITRIC ACID-SODIUM CITRATE 334-500 MG/5ML PO SOLN: 30.0000 mL | ORAL | Status: DC | PRN

## 2011-04-10 MED ORDER — ONDANSETRON HCL 4 MG/2ML IJ SOLN: 4.0000 mg | Freq: Four times a day (QID) | INTRAMUSCULAR | Status: DC | PRN

## 2011-04-10 MED ORDER — LACTATED RINGERS IV SOLN: 500.0000 mL | Freq: Once | INTRAVENOUS | Status: DC

## 2011-04-10 MED ORDER — LIDOCAINE HCL (PF) 1 % IJ SOLN
30.0000 mL | INTRAMUSCULAR | Status: DC | PRN
  Filled 2011-04-10: qty 30

## 2011-04-10 MED ORDER — ACETAMINOPHEN 325 MG PO TABS
650.0000 mg | ORAL_TABLET | ORAL | Status: DC | PRN
  Administered 2011-04-10 (×3): 650 mg via ORAL
  Filled 2011-04-10 (×3): qty 2

## 2011-04-10 MED ORDER — MUPIROCIN 2 % EX OINT
1.0000 "application " | TOPICAL_OINTMENT | Freq: Two times a day (BID) | CUTANEOUS | Status: DC
  Administered 2011-04-10 (×2): 1 via NASAL
  Filled 2011-04-10: qty 22

## 2011-04-10 MED ORDER — OXYCODONE-ACETAMINOPHEN 5-325 MG PO TABS: 2.0000 | ORAL_TABLET | ORAL | Status: DC | PRN

## 2011-04-10 MED ORDER — OXYTOCIN BOLUS FROM INFUSION
500.0000 mL | Freq: Once | INTRAVENOUS | Status: DC
  Filled 2011-04-10: qty 500

## 2011-04-10 MED ORDER — LIDOCAINE HCL 1.5 % IJ SOLN
INTRAMUSCULAR | Status: DC | PRN
  Administered 2011-04-10 (×2): 5 mL via EPIDURAL

## 2011-04-10 MED ORDER — CHLORHEXIDINE GLUCONATE CLOTH 2 % EX PADS
6.0000 | MEDICATED_PAD | Freq: Every day | CUTANEOUS | Status: DC
  Administered 2011-04-10 – 2011-04-11 (×2): 6 via TOPICAL

## 2011-04-10 MED ORDER — OXYTOCIN 20 UNITS IN LACTATED RINGERS INFUSION - SIMPLE
1.0000 m[IU]/min | INTRAVENOUS | Status: DC
  Administered 2011-04-10: 2 m[IU]/min via INTRAVENOUS

## 2011-04-10 MED ORDER — TERBUTALINE SULFATE 1 MG/ML IJ SOLN: 0.2500 mg | Freq: Once | INTRAMUSCULAR | Status: AC | PRN

## 2011-04-10 MED ORDER — LACTATED RINGERS IV SOLN
INTRAVENOUS | Status: DC
  Administered 2011-04-10 – 2011-04-11 (×4): via INTRAVENOUS

## 2011-04-10 MED ORDER — FENTANYL 2.5 MCG/ML BUPIVACAINE 1/10 % EPIDURAL INFUSION (WH - ANES)
14.0000 mL/h | INTRAMUSCULAR | Status: DC
  Administered 2011-04-11 (×2): 14 mL/h via EPIDURAL
  Filled 2011-04-10 (×3): qty 60

## 2011-04-10 MED ORDER — FLEET ENEMA 7-19 GM/118ML RE ENEM: 1.0000 | ENEMA | RECTAL | Status: DC | PRN

## 2011-04-10 MED ORDER — FENTANYL 2.5 MCG/ML BUPIVACAINE 1/10 % EPIDURAL INFUSION (WH - ANES)
INTRAMUSCULAR | Status: DC | PRN
  Administered 2011-04-10: 14 mL/h via EPIDURAL

## 2011-04-10 MED ORDER — DINOPROSTONE 10 MG VA INST
10.0000 mg | VAGINAL_INSERT | Freq: Once | VAGINAL | Status: AC
  Administered 2011-04-10: 10 mg via VAGINAL
  Filled 2011-04-10: qty 1

## 2011-04-10 MED ORDER — EPHEDRINE 5 MG/ML INJ: 10.0000 mg | INTRAVENOUS | Status: DC | PRN

## 2011-04-10 MED ORDER — OXYTOCIN 20 UNITS IN LACTATED RINGERS INFUSION - SIMPLE: 1.0000 [IU] | INTRAVENOUS | Status: DC

## 2011-04-10 MED ORDER — PHENYLEPHRINE 40 MCG/ML (10ML) SYRINGE FOR IV PUSH (FOR BLOOD PRESSURE SUPPORT)
80.0000 ug | PREFILLED_SYRINGE | INTRAVENOUS | Status: DC | PRN
  Filled 2011-04-10: qty 5

## 2011-04-10 MED ORDER — EPHEDRINE 5 MG/ML INJ
10.0000 mg | INTRAVENOUS | Status: DC | PRN
  Filled 2011-04-10: qty 4

## 2011-04-10 MED ORDER — DIPHENHYDRAMINE HCL 50 MG/ML IJ SOLN: 12.5000 mg | INTRAMUSCULAR | Status: DC | PRN

## 2011-04-10 MED ORDER — BUTORPHANOL TARTRATE 2 MG/ML IJ SOLN
1.0000 mg | INTRAMUSCULAR | Status: DC | PRN
Start: 2011-04-10 — End: 2011-04-11

## 2011-04-10 NOTE — Anesthesia Preprocedure Evaluation (Signed)
Anesthesia Evaluation  Patient identified by MRN, date of birth, ID band Patient awake    Reviewed: Allergy & Precautions, H&P , NPO status , Patient's Chart, lab work & pertinent test results  Airway Mallampati: III TM Distance: >3 FB Neck ROM: full    Dental No notable dental hx.    Pulmonary neg pulmonary ROS,  clear to auscultation  Pulmonary exam normal       Cardiovascular neg cardio ROS     Neuro/Psych Negative Neurological ROS  Negative Psych ROS   GI/Hepatic negative GI ROS, Neg liver ROS,   Endo/Other  Morbid obesity  Renal/GU negative Renal ROS  Genitourinary negative   Musculoskeletal negative musculoskeletal ROS (+)   Abdominal (+) obese,   Peds negative pediatric ROS (+)  Hematology negative hematology ROS (+)   Anesthesia Other Findings   Reproductive/Obstetrics (+) Pregnancy                           Anesthesia Physical Anesthesia Plan  ASA: III  Anesthesia Plan: Epidural   Post-op Pain Management:    Induction:   Airway Management Planned:   Additional Equipment:   Intra-op Plan:   Post-operative Plan:   Informed Consent: I have reviewed the patients History and Physical, chart, labs and discussed the procedure including the risks, benefits and alternatives for the proposed anesthesia with the patient or authorized representative who has indicated his/her understanding and acceptance.     Plan Discussed with:   Anesthesia Plan Comments:         Anesthesia Quick Evaluation

## 2011-04-10 NOTE — Anesthesia Procedure Notes (Signed)
Epidural Patient location during procedure: OB Start time: 04/10/2011 10:29 PM End time: 04/10/2011 10:35 PM Reason for block: procedure for pain  Staffing Anesthesiologist: Sandrea Hughs Performed by: anesthesiologist   Preanesthetic Checklist Completed: patient identified, site marked, surgical consent, pre-op evaluation, timeout performed, IV checked, risks and benefits discussed and monitors and equipment checked  Epidural Patient position: sitting Prep: site prepped and draped and DuraPrep Patient monitoring: continuous pulse ox and blood pressure Approach: midline Injection technique: LOR air  Needle:  Needle type: Tuohy  Needle gauge: 17 G Needle length: 9 cm Needle insertion depth: 7 cm Catheter type: closed end flexible Catheter size: 19 Gauge Catheter at skin depth: 12 cm Test dose: negative and 1.5% lidocaine  Assessment Sensory level: T8 Events: blood not aspirated, injection not painful, no injection resistance, negative IV test and no paresthesia

## 2011-04-10 NOTE — Progress Notes (Signed)
Sandra [name] is [name] 36 y.o. [phone] at [redacted]w[redacted]d by LMP admitted for induction of labor due to Low amniotic fluid..  Subjective: Uncomfortable  Objective: BP 99/52  Pulse 69  Temp(Src) 97.6 F (36.4 C) (Oral)  Resp 18  Ht 5\' 7"  (1.702 m)  Wt 141.522 kg (312 lb)  BMI 48.87 kg/m2      FHT:  FHR: 150 bpm, variability: moderate,  accelerations:  Present,  decelerations:  Absent UC:   irregular, every 5 minutes SVE:   Dilation: 7 Effacement (%): 70 Station: -3 Exam by:: Dr. Tamela [name]  Labs: Lab Results  Component Value Date   WBC 12.6* 04/10/2011   HGB 10.6* 04/10/2011   HCT 32.4* 04/10/2011   MCV 82.9 04/10/2011   PLT 163 04/10/2011    Assessment / Plan: Induction of labor due to oligohydramnios,  progressing well on pitocin  Labor: Progressing normally Preeclampsia:  N/[name] Fetal Wellbeing:  Category I Pain Control:  Labor support without medications I/D:  n/[name] Anticipated MOD:  NSVD  Sandra [name] 04/10/2011, 10:16 PM

## 2011-04-10 NOTE — H&P (Signed)
ALEJA YEARWOOD is a 36 y.o. female presenting for contractions. Maternal Medical History:  Reason for admission: Oligohydramnios.  AFI 2.5.  Amnisure negative.  Fetal activity: Perceived fetal activity is normal.    Prenatal complications: no prenatal complications   OB History    Grav Para Term Preterm Abortions TAB SAB Ect Mult Living   4 3 3       3      Past Medical History  Diagnosis Date  . Obesity    Past Surgical History  Procedure Date  . No past surgeries    Family History: family history includes Diabetes in her mother and Hyperlipidemia in her mother. Social History:  reports that she has quit smoking. She does not have any smokeless tobacco history on file. She reports that she does not drink alcohol or use illicit drugs.  ROS  Dilation: 1 Effacement (%): Thick Station: -3 Exam by:: Bennye Alm, Rn Blood pressure 96/48, pulse 86, temperature 97.7 F (36.5 C), temperature source Oral, resp. rate 18, height 5\' 7"  (1.702 m), weight 141.522 kg (312 lb). Exam Physical Exam  Prenatal labs: ABO, Rh: O/Positive/-- (05/27 0000) Antibody:   Rubella: Immune (05/27 0000) RPR: NON REACTIVE (01/19 0055)  HBsAg: Negative (05/27 0000)  HIV: Non-reactive (05/27 0000)  GBS: Negative (12/31 0000)   Assessment/Plan: 36 y.o. P3 w/an IUP @ [redacted]w[redacted]d.  Oligohydramnios, doubt PROM.  Unfavorable Bishop's score.  Admit Two-stage IOL   [name],Yicel Shannon A 04/10/2011, 11:42 AM

## 2011-04-10 NOTE — ED Notes (Signed)
Negative amniosure called to Dr Tamela Oddi.

## 2011-04-10 NOTE — ED Provider Notes (Signed)
Speculum exam to R/O ROM.  No leaking seen.  No pooling.  Small amount of vaginal discharge.  Amnisure done.  Nolene Bernheim, NP 04/10/11 0028

## 2011-04-10 NOTE — Progress Notes (Signed)
The vagina was prepped with betadine.  The anterior lip of the cervix was grasped with a ring forceps.  The Cooper balloon was introduced into the lower uterine segment.  The uterine and vaginal balloons were inflated.

## 2011-04-11 ENCOUNTER — Encounter (HOSPITAL_COMMUNITY): Payer: Self-pay | Admitting: *Deleted

## 2011-04-11 MED ORDER — TETANUS-DIPHTH-ACELL PERTUSSIS 5-2.5-18.5 LF-MCG/0.5 IM SUSP: 0.5000 mL | Freq: Once | INTRAMUSCULAR | Status: DC

## 2011-04-11 MED ORDER — MAGNESIUM HYDROXIDE 400 MG/5ML PO SUSP: 30.0000 mL | ORAL | Status: DC | PRN

## 2011-04-11 MED ORDER — MEDROXYPROGESTERONE ACETATE 150 MG/ML IM SUSP: 150.0000 mg | INTRAMUSCULAR | Status: DC | PRN

## 2011-04-11 MED ORDER — BENZOCAINE-MENTHOL 20-0.5 % EX AERO
INHALATION_SPRAY | CUTANEOUS | Status: AC
  Administered 2011-04-11: 1 via TOPICAL
  Filled 2011-04-11: qty 56

## 2011-04-11 MED ORDER — INFLUENZA VIRUS VACC SPLIT PF IM SUSP: 0.5000 mL | INTRAMUSCULAR | Status: DC | PRN

## 2011-04-11 MED ORDER — MEASLES, MUMPS & RUBELLA VAC ~~LOC~~ INJ
0.5000 mL | INJECTION | Freq: Once | SUBCUTANEOUS | Status: DC
  Filled 2011-04-11: qty 0.5

## 2011-04-11 MED ORDER — ONDANSETRON HCL 4 MG PO TABS: 4.0000 mg | ORAL_TABLET | ORAL | Status: DC | PRN

## 2011-04-11 MED ORDER — LIDOCAINE-EPINEPHRINE (PF) 2 %-1:200000 IJ SOLN
INTRAMUSCULAR | Status: DC | PRN
  Administered 2011-04-11: 10 mL

## 2011-04-11 MED ORDER — IBUPROFEN 600 MG PO TABS
600.0000 mg | ORAL_TABLET | Freq: Four times a day (QID) | ORAL | Status: DC
  Administered 2011-04-11 – 2011-04-13 (×8): 600 mg via ORAL
  Filled 2011-04-11 (×8): qty 1

## 2011-04-11 MED ORDER — DIPHENHYDRAMINE HCL 25 MG PO CAPS: 25.0000 mg | ORAL_CAPSULE | Freq: Four times a day (QID) | ORAL | Status: DC | PRN

## 2011-04-11 MED ORDER — DIBUCAINE 1 % RE OINT: 1.0000 "application " | TOPICAL_OINTMENT | RECTAL | Status: DC | PRN

## 2011-04-11 MED ORDER — ZOLPIDEM TARTRATE 5 MG PO TABS: 5.0000 mg | ORAL_TABLET | Freq: Every evening | ORAL | Status: DC | PRN

## 2011-04-11 MED ORDER — BENZOCAINE-MENTHOL 20-0.5 % EX AERO
1.0000 "application " | INHALATION_SPRAY | CUTANEOUS | Status: DC | PRN
  Administered 2011-04-11: 1 via TOPICAL

## 2011-04-11 MED ORDER — FERROUS SULFATE 325 (65 FE) MG PO TABS
325.0000 mg | ORAL_TABLET | Freq: Two times a day (BID) | ORAL | Status: DC
  Administered 2011-04-11 – 2011-04-13 (×3): 325 mg via ORAL
  Filled 2011-04-11 (×3): qty 1

## 2011-04-11 MED ORDER — WITCH HAZEL-GLYCERIN EX PADS: 1.0000 "application " | MEDICATED_PAD | CUTANEOUS | Status: DC | PRN

## 2011-04-11 MED ORDER — LANOLIN HYDROUS EX OINT: TOPICAL_OINTMENT | CUTANEOUS | Status: DC | PRN

## 2011-04-11 MED ORDER — PRENATAL MULTIVITAMIN CH
1.0000 | ORAL_TABLET | Freq: Every day | ORAL | Status: DC
  Administered 2011-04-11 – 2011-04-13 (×3): 1 via ORAL
  Filled 2011-04-11 (×3): qty 1

## 2011-04-11 MED ORDER — OXYCODONE-ACETAMINOPHEN 5-325 MG PO TABS
1.0000 | ORAL_TABLET | ORAL | Status: DC | PRN
  Administered 2011-04-11 – 2011-04-12 (×3): 1 via ORAL
  Filled 2011-04-11 (×3): qty 1

## 2011-04-11 MED ORDER — SENNOSIDES-DOCUSATE SODIUM 8.6-50 MG PO TABS
2.0000 | ORAL_TABLET | Freq: Every day | ORAL | Status: DC
  Administered 2011-04-11: 2 via ORAL

## 2011-04-11 MED ORDER — ONDANSETRON HCL 4 MG/2ML IJ SOLN: 4.0000 mg | INTRAMUSCULAR | Status: DC | PRN

## 2011-04-11 NOTE — Progress Notes (Signed)
SVD of viable female 

## 2011-04-11 NOTE — Progress Notes (Signed)
[name] is a 36 y.o. [phone] at [redacted]w[redacted]d by LMP admitted for induction of labor due to Low amniotic fluid..  Subjective: Uncomfortable  Objective: BP 107/90  Pulse 112  Temp(Src) 98 F (36.7 C) (Oral)  Resp 18  Ht 5\' 7"  (1.702 m)  Wt 141.522 kg (312 lb)  BMI 48.87 kg/m2  SpO2 100%      FHT:  FHR: 150 bpm, variability: moderate,  accelerations:  Present,  decelerations:  Absent UC:   irregular, every 5 minutes SVE:   Dilation: 10 Effacement (%): 100 Station: +1 Exam by:: Dr. Tamela Oddi LOP, caput  Labs: Lab Results  Component Value Date   WBC 12.6* 04/10/2011   HGB 10.6* 04/10/2011   HCT 32.4* 04/10/2011   MCV 82.9 04/10/2011   PLT 163 04/10/2011    Assessment / Plan: Stage II--prolonged.  Malposition, maternal obesity, h/o LGA infants  Exaggerated SIMMS positioning, passive second stage for now   Fetal Wellbeing:  Category I Pain Control:  Epidural I/D:  n/a Anticipated MOD:  NSVD  [name],Antwine Agosto A 04/11/2011, 6:26 AM

## 2011-04-11 NOTE — Progress Notes (Signed)
Pt. Minimal progress with pushing. Attempted to labor down but pt. Reports pressure and pain to left side. Spoke with Dr, Tamela Oddi. Plan of care discussed with pt. Notified anesthesia for epidural bolus and will labor down

## 2011-04-11 NOTE — Anesthesia Postprocedure Evaluation (Signed)
Anesthesia Post Note  Patient: [name]  Procedure(s) Performed: * No procedures listed *  Anesthesia type: Epidural  Patient location: Mother/Baby  Post pain: Pain level controlled  Post assessment: Post-op Vital signs reviewed  Last Vitals:  Filed Vitals:   04/11/11 1400  BP: 114/75  Pulse: 81  Temp: 36.6 C  Resp: 20    Post vital signs: Reviewed  Level of consciousness: awake  Complications: No apparent anesthesia complications

## 2011-04-11 NOTE — Anesthesia Postprocedure Evaluation (Signed)
  Anesthesia Post-op Note  Patient: [name]  Procedure(s) Performed: * No procedures listed *  Patient Location: Mother/Baby  Anesthesia Type: Epidural  Level of Consciousness: awake, alert  and oriented  Airway and Oxygen Therapy: Patient Spontanous Breathing  Post-op Pain: mild  Post-op Assessment: Patient's Cardiovascular Status Stable, Respiratory Function Stable, Patent Airway, No signs of Nausea or vomiting, Pain level controlled and No headache  Post-op Vital Signs: stable  Complications: No apparent anesthesia complications

## 2011-04-12 LAB — CBC
MCH: 26.9 pg (ref 26.0–34.0)
MCHC: 31.8 g/dL (ref 30.0–36.0)
Platelets: 145 10*3/uL — ABNORMAL LOW (ref 150–400)

## 2011-04-12 MED ORDER — HYDROCORTISONE 1 % EX CREA
1.0000 "application " | TOPICAL_CREAM | Freq: Four times a day (QID) | CUTANEOUS | Status: DC
  Administered 2011-04-12 – 2011-04-13 (×4): 1 via TOPICAL
  Filled 2011-04-12: qty 28

## 2011-04-12 MED ORDER — CHLORHEXIDINE GLUCONATE CLOTH 2 % EX PADS
6.0000 | MEDICATED_PAD | Freq: Every day | CUTANEOUS | Status: DC
  Administered 2011-04-12 – 2011-04-13 (×2): 6 via TOPICAL

## 2011-04-12 MED ORDER — BETHANECHOL CHLORIDE 10 MG PO TABS
10.0000 mg | ORAL_TABLET | Freq: Four times a day (QID) | ORAL | Status: DC
  Administered 2011-04-12 (×4): 10 mg via ORAL
  Filled 2011-04-12 (×6): qty 1

## 2011-04-12 MED ORDER — MUPIROCIN 2 % EX OINT
1.0000 "application " | TOPICAL_OINTMENT | Freq: Two times a day (BID) | CUTANEOUS | Status: DC
  Administered 2011-04-12 – 2011-04-13 (×3): 1 via NASAL

## 2011-04-12 NOTE — Plan of Care (Signed)
Problem: Phase I Progression Outcomes Goal: Pain controlled with appropriate interventions Outcome: Completed/Met Date Met:  04/12/11 Patient states good pain control with Percocet Goal: Voiding adequately Outcome: Not Progressing Foley for D/C this AM to see if patient can void Goal: Foley catheter patent Outcome: Completed/Met Date Met:  04/12/11 Foley draining large amts of amber urine Goal: OOB as tolerated unless otherwise ordered Outcome: Completed/Met Date Met:  04/12/11 Patient does not like to get out of bed and move.Patient want's everything handed to her,and has to be told that she needs to change her peri pads. Goal: VS, stable, temp < 100.4 degrees F Outcome: Completed/Met Date Met:  04/12/11 Vital signs stable at this time Goal: Initial discharge plan identified Outcome: Completed/Met Date Met:  04/12/11 Understands self care Good pain control Able to independely void Understands infant care Goal: Other Phase I Outcomes/Goals Outcome: Progressing Patient needs to be able to void on her own  Problem: Phase II Progression Outcomes Goal: Progress activity as tolerated unless otherwise ordered Outcome: Progressing Needs to be encouraged to do her own self care.

## 2011-04-12 NOTE — Progress Notes (Signed)
Post Partum Day 1 Subjective: Difficulty voiding.  Objective: Blood pressure 94/60, pulse 79, temperature 97.6 F (36.4 C), temperature source Oral, resp. rate 20, height 5\' 7"  (1.702 m), weight 141.522 kg (312 lb), SpO2 98.00%, unknown if currently breastfeeding.  Physical Exam:  General: alert and no distress Lochia: appropriate Uterine Fundus: firm Incision: healing well DVT Evaluation: No evidence of DVT seen on physical exam.   Basename 04/12/11 0520 04/10/11 0055  HGB 10.0* 10.6*  HCT 31.4* 32.4*    Assessment/Plan: Plan for discharge tomorrow Urecholine 10 mg p.o. Qid for urinary retention.  LOS: 3 days   Garlin Batdorf A 04/12/2011, 8:11 AM

## 2011-04-12 NOTE — Progress Notes (Signed)
UR chart review completed.  

## 2011-04-13 MED ORDER — IBUPROFEN 600 MG PO TABS: 600.0000 mg | ORAL_TABLET | Freq: Four times a day (QID) | ORAL | Status: DC

## 2011-04-13 NOTE — Progress Notes (Signed)
Post Partum Day 2 Subjective: no complaints  Objective: Blood pressure 102/48, pulse 48, temperature 97.6 F (36.4 C), temperature source Oral, resp. rate 16, height 5\' 7"  (1.702 m), weight 141.522 kg (312 lb), SpO2 98.00%, unknown if currently breastfeeding.  Physical Exam:  General: alert and no distress Lochia: appropriate Uterine Fundus: firm Incision: healing well DVT Evaluation: No evidence of DVT seen on physical exam.   Basename 04/12/11 0520  HGB 10.0*  HCT 31.4*    Assessment/Plan: Discharge home   LOS: 4 days   Lenord Fralix A 04/13/2011, 9:14 AM

## 2011-05-28 ENCOUNTER — Other Ambulatory Visit: Payer: Self-pay | Admitting: Obstetrics & Gynecology

## 2011-05-28 ENCOUNTER — Encounter (HOSPITAL_COMMUNITY): Payer: Self-pay

## 2011-06-03 ENCOUNTER — Encounter (HOSPITAL_COMMUNITY): Payer: Self-pay

## 2011-06-03 ENCOUNTER — Encounter (HOSPITAL_COMMUNITY)
Admission: RE | Admit: 2011-06-03 | Discharge: 2011-06-03 | Disposition: A | Discharge status: Home / Self Care | Payer: Medicaid Other | Source: Ambulatory Visit | Attending: Obstetrics & Gynecology | Admitting: Obstetrics & Gynecology

## 2011-06-03 HISTORY — DX: Headache: R51

## 2011-06-03 HISTORY — DX: Major depressive disorder, single episode, unspecified: F32.9

## 2011-06-03 HISTORY — DX: Other specified behavioral and emotional disorders with onset usually occurring in childhood and adolescence: F98.8

## 2011-06-03 HISTORY — DX: Depression, unspecified: F32.A

## 2011-06-03 HISTORY — DX: Heartburn: R12

## 2011-06-03 LAB — CBC
Hemoglobin: 11.4 g/dL — ABNORMAL LOW (ref 12.0–15.0)
MCHC: 31.7 g/dL (ref 30.0–36.0)
RBC: 4.33 MIL/uL (ref 3.87–5.11)
WBC: 11.1 10*3/uL — ABNORMAL HIGH (ref 4.0–10.5)

## 2011-06-03 LAB — SURGICAL PCR SCREEN
MRSA, PCR: NEGATIVE
Staphylococcus aureus: NEGATIVE

## 2011-06-03 NOTE — Patient Instructions (Addendum)
   Your procedure is scheduled on:  Friday, March 22nd  Enter through the Main Entrance of [facility] at:  1130am Pick up the phone at the desk and dial [phone] and inform us of your arrival.  Please call this number if you have any problems the morning of surgery: [phone]  Remember: Do not eat food after midnight: Thursday Do not drink clear liquids after:  9am Friday Take these medicines the morning of surgery with a SIP OF WATER: protonix, prenatals, birth control pill  Do not wear jewelry, make-up, or FINGER nail polish Do not wear lotions, powders, perfumes or deodorant. Do not shave 48 hours prior to surgery. Do not bring valuables to the hospital. Contacts, dentures or bridgework may not be worn into surgery.  Patients discharged on the day of surgery will not be allowed to drive home.  Home with Sister Cala Bradford Simoneaux  cell  [phone]   Remember to use your hibiclens as instructed.Please shower with 1/2 bottle the evening before your surgery and the other 1/2 bottle the morning of surgery. Neck down avoiding private area.

## 2011-06-11 ENCOUNTER — Ambulatory Visit (HOSPITAL_COMMUNITY)
Admission: RE | Admit: 2011-06-11 | Discharge: 2011-06-11 | Disposition: A | Discharge status: Home / Self Care | Payer: Medicaid Other | Source: Ambulatory Visit | Attending: Obstetrics & Gynecology | Admitting: Obstetrics & Gynecology

## 2011-06-11 ENCOUNTER — Encounter (HOSPITAL_COMMUNITY): Payer: Self-pay | Admitting: Anesthesiology

## 2011-06-11 ENCOUNTER — Encounter (HOSPITAL_COMMUNITY)
Admission: RE | Disposition: A | Discharge status: Home / Self Care | Payer: Self-pay | Source: Ambulatory Visit | Attending: Obstetrics & Gynecology

## 2011-06-11 ENCOUNTER — Ambulatory Visit (HOSPITAL_COMMUNITY): Payer: Medicaid Other | Admitting: Anesthesiology

## 2011-06-11 ENCOUNTER — Encounter (HOSPITAL_COMMUNITY): Payer: Self-pay | Admitting: Obstetrics & Gynecology

## 2011-06-11 DIAGNOSIS — Z01818 Encounter for other preprocedural examination: Secondary | ICD-10-CM | POA: Insufficient documentation

## 2011-06-11 DIAGNOSIS — Z01812 Encounter for preprocedural laboratory examination: Secondary | ICD-10-CM | POA: Insufficient documentation

## 2011-06-11 DIAGNOSIS — Z302 Encounter for sterilization: Secondary | ICD-10-CM

## 2011-06-11 DIAGNOSIS — Z641 Problems related to multiparity: Secondary | ICD-10-CM | POA: Insufficient documentation

## 2011-06-11 HISTORY — PX: LAPAROSCOPIC TUBAL LIGATION: SHX1937

## 2011-06-11 LAB — PREGNANCY, URINE: Preg Test, Ur: NEGATIVE

## 2011-06-11 SURGERY — LIGATION, FALLOPIAN TUBE, LAPAROSCOPIC
Anesthesia: General | Site: Abdomen | Wound class: Clean

## 2011-06-11 MED ORDER — MIDAZOLAM HCL 5 MG/5ML IJ SOLN
INTRAMUSCULAR | Status: DC | PRN
  Administered 2011-06-11 (×2): 1 mg via INTRAVENOUS

## 2011-06-11 MED ORDER — LIDOCAINE HCL (CARDIAC) 20 MG/ML IV SOLN
INTRAVENOUS | Status: DC | PRN
  Administered 2011-06-11: 100 mg via INTRAVENOUS

## 2011-06-11 MED ORDER — KETOROLAC TROMETHAMINE 60 MG/2ML IM SOLN
INTRAMUSCULAR | Status: AC
  Filled 2011-06-11: qty 2

## 2011-06-11 MED ORDER — NEOSTIGMINE METHYLSULFATE 1 MG/ML IJ SOLN
INTRAMUSCULAR | Status: AC
  Filled 2011-06-11: qty 10

## 2011-06-11 MED ORDER — DEXAMETHASONE SODIUM PHOSPHATE 10 MG/ML IJ SOLN
INTRAMUSCULAR | Status: AC
  Filled 2011-06-11: qty 1

## 2011-06-11 MED ORDER — GLYCOPYRROLATE 0.2 MG/ML IJ SOLN
INTRAMUSCULAR | Status: DC | PRN
  Administered 2011-06-11: .8 mg via INTRAVENOUS

## 2011-06-11 MED ORDER — PROPOFOL 10 MG/ML IV EMUL
INTRAVENOUS | Status: DC | PRN
  Administered 2011-06-11: 200 mg via INTRAVENOUS

## 2011-06-11 MED ORDER — SUCCINYLCHOLINE CHLORIDE 20 MG/ML IJ SOLN
INTRAMUSCULAR | Status: AC
  Filled 2011-06-11: qty 10

## 2011-06-11 MED ORDER — FENTANYL CITRATE 0.05 MG/ML IJ SOLN
INTRAMUSCULAR | Status: AC
  Filled 2011-06-11: qty 5

## 2011-06-11 MED ORDER — MEPERIDINE HCL 25 MG/ML IJ SOLN: 6.2500 mg | INTRAMUSCULAR | Status: DC | PRN

## 2011-06-11 MED ORDER — PANTOPRAZOLE SODIUM 40 MG PO TBEC
DELAYED_RELEASE_TABLET | ORAL | Status: AC
  Administered 2011-06-11: 40 mg via ORAL
  Filled 2011-06-11: qty 1

## 2011-06-11 MED ORDER — MIDAZOLAM HCL 2 MG/2ML IJ SOLN
INTRAMUSCULAR | Status: AC
  Filled 2011-06-11: qty 2

## 2011-06-11 MED ORDER — CITRIC ACID-SODIUM CITRATE 334-500 MG/5ML PO SOLN
30.0000 mL | Freq: Once | ORAL | Status: AC
  Administered 2011-06-11: 30 mL via ORAL

## 2011-06-11 MED ORDER — FENTANYL CITRATE 0.05 MG/ML IJ SOLN
INTRAMUSCULAR | Status: AC
  Filled 2011-06-11: qty 2

## 2011-06-11 MED ORDER — PANTOPRAZOLE SODIUM 40 MG PO TBEC: 40.0000 mg | DELAYED_RELEASE_TABLET | Freq: Once | ORAL | Status: DC

## 2011-06-11 MED ORDER — ONDANSETRON HCL 4 MG/2ML IJ SOLN
INTRAMUSCULAR | Status: DC | PRN
  Administered 2011-06-11: 4 mg via INTRAVENOUS

## 2011-06-11 MED ORDER — GLYCOPYRROLATE 0.2 MG/ML IJ SOLN
INTRAMUSCULAR | Status: AC
  Filled 2011-06-11: qty 2

## 2011-06-11 MED ORDER — LIDOCAINE HCL (CARDIAC) 20 MG/ML IV SOLN
INTRAVENOUS | Status: AC
Start: 2011-06-11 — End: 2011-06-11
  Filled 2011-06-11: qty 5

## 2011-06-11 MED ORDER — NEOSTIGMINE METHYLSULFATE 1 MG/ML IJ SOLN
INTRAMUSCULAR | Status: DC | PRN
  Administered 2011-06-11: 4 mg via INTRAVENOUS

## 2011-06-11 MED ORDER — ROCURONIUM BROMIDE 50 MG/5ML IV SOLN
INTRAVENOUS | Status: AC
  Filled 2011-06-11: qty 1

## 2011-06-11 MED ORDER — FENTANYL CITRATE 0.05 MG/ML IJ SOLN
INTRAMUSCULAR | Status: DC | PRN
  Administered 2011-06-11: 50 ug via INTRAVENOUS
  Administered 2011-06-11 (×3): 100 ug via INTRAVENOUS

## 2011-06-11 MED ORDER — DEXAMETHASONE SODIUM PHOSPHATE 4 MG/ML IJ SOLN
INTRAMUSCULAR | Status: DC | PRN
  Administered 2011-06-11: 10 mg via INTRAVENOUS

## 2011-06-11 MED ORDER — ONDANSETRON HCL 4 MG/2ML IJ SOLN
INTRAMUSCULAR | Status: AC
  Filled 2011-06-11: qty 2

## 2011-06-11 MED ORDER — OXYCODONE-ACETAMINOPHEN 5-325 MG PO TABS: 2.0000 | ORAL_TABLET | Freq: Four times a day (QID) | ORAL | Status: AC | PRN

## 2011-06-11 MED ORDER — BUPIVACAINE HCL (PF) 0.25 % IJ SOLN
INTRAMUSCULAR | Status: AC
  Filled 2011-06-11: qty 30

## 2011-06-11 MED ORDER — LACTATED RINGERS IV SOLN
INTRAVENOUS | Status: DC
  Administered 2011-06-11 (×3): via INTRAVENOUS

## 2011-06-11 MED ORDER — METOCLOPRAMIDE HCL 5 MG/ML IJ SOLN: 10.0000 mg | Freq: Once | INTRAMUSCULAR | Status: DC | PRN

## 2011-06-11 MED ORDER — PANTOPRAZOLE SODIUM 40 MG PO TBEC
40.0000 mg | DELAYED_RELEASE_TABLET | Freq: Once | ORAL | Status: AC
  Administered 2011-06-11: 40 mg via ORAL

## 2011-06-11 MED ORDER — SUCCINYLCHOLINE CHLORIDE 20 MG/ML IJ SOLN
INTRAMUSCULAR | Status: DC | PRN
  Administered 2011-06-11: 120 mg via INTRAVENOUS

## 2011-06-11 MED ORDER — ROCURONIUM BROMIDE 100 MG/10ML IV SOLN
INTRAVENOUS | Status: DC | PRN
  Administered 2011-06-11: 40 mg via INTRAVENOUS
  Administered 2011-06-11: 5 mg via INTRAVENOUS

## 2011-06-11 MED ORDER — 0.9 % SODIUM CHLORIDE (POUR BTL) OPTIME
TOPICAL | Status: DC | PRN
  Administered 2011-06-11: 1000 mL

## 2011-06-11 MED ORDER — CITRIC ACID-SODIUM CITRATE 334-500 MG/5ML PO SOLN
ORAL | Status: AC
  Administered 2011-06-11: 30 mL via ORAL
  Filled 2011-06-11: qty 15

## 2011-06-11 MED ORDER — BUPIVACAINE HCL (PF) 0.25 % IJ SOLN
INTRAMUSCULAR | Status: DC | PRN
  Administered 2011-06-11: 3 mL

## 2011-06-11 MED ORDER — PROPOFOL 10 MG/ML IV EMUL
INTRAVENOUS | Status: AC
  Filled 2011-06-11: qty 20

## 2011-06-11 MED ORDER — KETOROLAC TROMETHAMINE 60 MG/2ML IM SOLN
INTRAMUSCULAR | Status: DC | PRN
  Administered 2011-06-11: 60 mg via INTRAMUSCULAR

## 2011-06-11 SURGICAL SUPPLY — 20 items
BENZOIN TINCTURE PRP APPL 2/3 (GAUZE/BANDAGES/DRESSINGS) ×2 IMPLANT
CATH ROBINSON RED A/P 16FR (CATHETERS) ×2 IMPLANT
CHLORAPREP W/TINT 26ML (MISCELLANEOUS) ×2 IMPLANT
CLOTH BEACON ORANGE TIMEOUT ST (SAFETY) ×2 IMPLANT
DERMABOND ADVANCED (GAUZE/BANDAGES/DRESSINGS)
DERMABOND ADVANCED .7 DNX12 (GAUZE/BANDAGES/DRESSINGS) IMPLANT
GLOVE BIO SURGEON STRL SZ 6.5 (GLOVE) ×8 IMPLANT
GLOVE INDICATOR 7.0 STRL GRN (GLOVE) ×4 IMPLANT
GLOVE SURG SS PI 6.5 STRL IVOR (GLOVE) ×2 IMPLANT
GOWN PREVENTION PLUS LG XLONG (DISPOSABLE) ×8 IMPLANT
NEEDLE HYPO 25X1 1.5 SAFETY (NEEDLE) ×2 IMPLANT
PACK LAPAROSCOPY BASIN (CUSTOM PROCEDURE TRAY) ×2 IMPLANT
STRIP CLOSURE SKIN 1/4X4 (GAUZE/BANDAGES/DRESSINGS) ×2 IMPLANT
SUT VIC AB 3-0 PS2 18 (SUTURE) ×1
SUT VIC AB 3-0 PS2 18XBRD (SUTURE) ×1 IMPLANT
SUT VICRYL 0 UR6 27IN ABS (SUTURE) ×2 IMPLANT
TOWEL OR 17X24 6PK STRL BLUE (TOWEL DISPOSABLE) ×4 IMPLANT
TROCAR 12M 150ML BLUNT (TROCAR) ×2 IMPLANT
TROCAR XCEL NON-BLD 11X100MML (ENDOMECHANICALS) ×2 IMPLANT
WATER STERILE IRR 1000ML POUR (IV SOLUTION) IMPLANT

## 2011-06-11 NOTE — Preoperative (Signed)
Beta Blockers   Reason not to administer Beta Blockers:Not Applicable 

## 2011-06-11 NOTE — Anesthesia Preprocedure Evaluation (Signed)
Anesthesia Evaluation  Patient identified by MRN, date of birth, ID band Patient awake    Reviewed: Allergy & Precautions, H&P , Patient's Chart, lab work & pertinent test results  Airway Mallampati: I TM Distance: >3 FB Neck ROM: full    Dental No notable dental hx. (+) Teeth Intact and Chipped   Pulmonary Recent URI , Resolved, former smoker Quit smoking 1 week ago. breath sounds clear to auscultation  Pulmonary exam normal       Cardiovascular negative cardio ROS  Rhythm:regular Rate:Normal     Neuro/Psych  Headaches, PSYCHIATRIC DISORDERS Depression    GI/Hepatic Neg liver ROS, GERD-  Medicated and Poorly Controlled,Active Reflux this am.   Endo/Other  Morbid obesity  Renal/GU negative Renal ROS     Musculoskeletal negative musculoskeletal ROS (+)   Abdominal Normal abdominal exam  (+)   Peds  Hematology negative hematology ROS (+)   Anesthesia Other Findings   Reproductive/Obstetrics negative OB ROS (+) Pregnancy                           Anesthesia Physical Anesthesia Plan  ASA: III  Anesthesia Plan: General ETT   Post-op Pain Management:    Induction: Intravenous, Rapid sequence and Cricoid pressure planned  Airway Management Planned:   Additional Equipment:   Intra-op Plan:   Post-operative Plan: Extubation in OR  Informed Consent: I have reviewed the patients History and Physical, chart, labs and discussed the procedure including the risks, benefits and alternatives for the proposed anesthesia with the patient or authorized representative who has indicated his/her understanding and acceptance.   Dental Advisory Given  Plan Discussed with: Anesthesiologist, CRNA and Surgeon  Anesthesia Plan Comments:         Anesthesia Quick Evaluation

## 2011-06-11 NOTE — Op Note (Signed)
Procedure Note  [name] 36 y.o. 06/11/2011  Preoperative Diagnosis:  Multiparity, desires a sterilization procedure  Postoperative Diagnosis: Same  Procedure: Laparoscopic bilateral tubal ligation with fulguration  Surgeon: Antionette Char A  Indications:  The patient now presents for a sterilization procedure after discussing therapeutic alternatives.        Procedure Detail:  The patient was taken to the operating room and was placed on the operating table in the dorsal supine position.  After his satisfactory general anesthesia was achieved, the patient was placed in the semi-lithotomy position using Allen stirrups. The patient was prepped and draped in the usual sterile manner for vaginal laparoscopic procedure. A speculum was placed in the vagina. The anterior lip of the cervix was grasped with a single-tooth tenaculum. A Hulka manipulator was then advanced into the uterus and secured  to the anterior lip of the cervix as a means to manipulate the uterus. The single-tooth tenaculum and Hulka manipulator were then removed. The infraumbilical region was then anesthetized with local anesthesia, 0.25%  Marcaine. A small incision was made to the skin and subcutaneous tissue. A 10 mm Optiview trocar was placed through the incision into the abdominal cavity diagnostic laparoscope with video camera attached was placed through the trocar sleeve and carbon dioxide was used to insufflate the abdominal and pelvic cavity. The pelvic contents were examined and the findings were described above. Kleppinger bipolar forceps were inserted through the operative port of the laparoscope. The left fallopian tube was identified and traced out to its fimbriated end. Then starting at the distal isthmus the proximal ampullary portion of the tube was coagulated in 4 contiguous areas. Each time the resistance meter went to 0 and the tube had been retracted away from an adjacent viscera. The right fallopian tube  was then manipulated in a similar fashion. The scope was removed and the excess carbon dioxide was remove through the port, before it was removed.  The fascial layer of the incision was reapproximated with an interrupted figure-of eight 0-Vicryl suture on a UR 6 needle. The skin was reapproximated with running subcuticluar stitches of 3-0 Vicryl suture. The instruments removed from the vagina there is minimal bleeding from the cervix. Final sponge, instrument and needle counts were correct. The patient was awakened on the operating table and taken to the PACU in satisfactory condition.    Findings:  Normal pelvic anatomy  Estimated Blood Loss:  Minimal              Total IV Fluids: per Anesthesiology        Condition: stable

## 2011-06-11 NOTE — Transfer of Care (Signed)
Immediate Anesthesia Transfer of Care Note  Patient: [name]  Procedure(s) Performed: Procedure(s) (LRB): LAPAROSCOPIC TUBAL LIGATION (N/A)  Patient Location: PACU  Anesthesia Type: General  Level of Consciousness: awake, alert , oriented and patient cooperative  Airway & Oxygen Therapy: Patient Spontanous Breathing and Patient connected to face mask oxygen  Post-op Assessment: Report given to PACU RN and Post -op Vital signs reviewed and stable  Post vital signs: Reviewed and stable  Complications: No apparent anesthesia complications

## 2011-06-11 NOTE — H&P (Signed)
  Chief Complaint: 36 y.o. who presents for a laparoscopic BTL  Details of Present Illness: See above  BP 120/56  Pulse 73  Temp(Src) 97.7 F (36.5 C) (Oral)  Resp 16  SpO2 100%  Breastfeeding? No  Past Medical History  Diagnosis Date  . Obesity   . Recurrent upper respiratory infection (URI)     cold sx 2 wks ago   . Heartburn   . Headache     tx w/OTC meds prn  . Depression     no meds  . ADD (attention deficit disorder) without hyperactivity     no meds   History   Social History  . Marital Status: Legally Separated    Spouse Name: N/A    Number of Children: N/A  . Years of Education: N/A   Occupational History  . Not on file.   Social History Main Topics  . Smoking status: Former Smoker -- 0.2 packs/day for 4 years    Types: Cigarettes    Quit date: 01/21/2011  . Smokeless tobacco: Never Used  . Alcohol Use: Yes     socially  . Drug Use: No  . Sexually Active: Not Currently    Birth Control/ Protection: None   Other Topics Concern  . Not on file   Social History Narrative  . No narrative on file   Family History  Problem Relation Age of Onset  . Diabetes Mother   . Hyperlipidemia Mother     Pertinent items are noted in HPI.  Pre-Op Diagnosis: DESIRES STERILIZATION    Planned Procedure: Procedure(s): LAPAROSCOPIC TUBAL LIGATION  I have reviewed the patient's history and have completed the physical exam and LEVITA MONICAL is acceptable for surgery.  Roseanna Rainbow, MD 06/11/2011 10:09 AM

## 2011-06-11 NOTE — Anesthesia Postprocedure Evaluation (Signed)
Anesthesia Post Note  Patient: [name]  Procedure(s) Performed: Procedure(s) (LRB): LAPAROSCOPIC TUBAL LIGATION (N/A)  Anesthesia type: General  Patient location: PACU  Post pain: Pain level controlled  Post assessment: Post-op Vital signs reviewed  Last Vitals:  Filed Vitals:   06/11/11 0941  BP: 120/56  Pulse: 73  Temp: 36.5 C  Resp: 16    Post vital signs: Reviewed  Level of consciousness: sedated  Complications: No apparent anesthesia complicationsfj

## 2011-06-11 NOTE — Discharge Instructions (Addendum)
Laparoscopy for Tubal Ligation Care After Laparoscopic tubal ligation is an operation done with a long, lighted tube inserted through a small cut (incision) in the abdomen. The fallopian tubes are blocked by tying, clamping with a plastic clamp, or burning them closed with an electrocautery. Read the instructions outlined below and refer to this sheet in the next few weeks. These instructions provide you with general information on caring for yourself after you leave the hospital. Your caregiver may also give you specific instructions. While your treatment has been planned according to the most current medical practices available, unavoidable complications may occur. If you have any problems or questions after discharge, please call your caregiver. HOME CARE INSTRUCTIONS  It will be normal to be sore for a couple days following surgery.   Take your medicine and follow the instructions from your caregiver.   You may resume usual diet, exercise, driving and activities as allowed by your caregiver.   Do not have sexual intercourse until your caregiver gives you permission.   Do not drive while taking pain medicine.   Avoid lifting until you are instructed otherwise.   Use showers for bathing, until you are seen by your caregiver.   Change dressings if needed, and as directed.   Only take over-the-counter or prescription medicines for pain, discomfort or fever as directed by your caregiver.   Do not take aspirin because it can cause bleeding.   Take your temperature twice a day and record it.   Have someone stay with you the day you have the operation, and for a couple days afterward.   Make an appointment to see your caregiver for stitches (sutures) or staple removal and postoperative exams, as instructed.   Continue your current form of contraception until your next menses SEEK MEDICAL CARE IF:  There is redness, swelling, or increasing pain in a wound.   There is drainage from a  wound lasting longer than one day.   Your pain is getting worse.   You develop a rash.   You are having a reaction to your medicine.   You become dizzy or lightheaded.   You need stronger medicine or a change in your pain medicine.   You notice a foul smell coming from a wound or dressing.   There is a breaking open of a wound after the stitches, staples, or skin adhesive strips have been removed.   You develop constipation.  SEEK IMMEDIATE MEDICAL CARE IF:  You have an oral temperature above 100.6, not controlled by medicine.   There is increasing abdominal pain.   You develop pain in your shoulders (shoulder strap areas) which becomes more severe. Some pain is common, because of the gas inserted into your abdomen during the procedure.   You develop bleeding or drainage from the suture sites or vagina (birth canal) following surgery.   You pass out.   You develop shortness of breath or difficulty breathing.   You develop chest or leg pain.   You develop persistent nausea, vomiting or diarrhea.  MAKE SURE YOU:   Understand these instructions.   Watch your condition.   Get help right away if you are not doing well or get worse.  Document Released: 09/25/2004 Document Re-Released: 08/26/2009 [facility] Patient Information 2011 [place], [place].

## 2011-06-14 ENCOUNTER — Encounter (HOSPITAL_COMMUNITY): Payer: Self-pay | Admitting: Obstetrics & Gynecology

## 2011-09-07 ENCOUNTER — Emergency Department (HOSPITAL_COMMUNITY): Payer: Medicaid Other

## 2011-09-07 ENCOUNTER — Encounter (HOSPITAL_COMMUNITY): Payer: Self-pay | Admitting: Physical Medicine and Rehabilitation

## 2011-09-07 ENCOUNTER — Emergency Department (HOSPITAL_COMMUNITY)
Admission: EM | Admit: 2011-09-07 | Discharge: 2011-09-08 | Disposition: A | Discharge status: Home / Self Care | Payer: Medicaid Other | Attending: Emergency Medicine | Admitting: Emergency Medicine

## 2011-09-07 DIAGNOSIS — E669 Obesity, unspecified: Secondary | ICD-10-CM | POA: Insufficient documentation

## 2011-09-07 DIAGNOSIS — R079 Chest pain, unspecified: Secondary | ICD-10-CM | POA: Insufficient documentation

## 2011-09-07 DIAGNOSIS — F988 Other specified behavioral and emotional disorders with onset usually occurring in childhood and adolescence: Secondary | ICD-10-CM | POA: Insufficient documentation

## 2011-09-07 DIAGNOSIS — R0789 Other chest pain: Secondary | ICD-10-CM

## 2011-09-07 DIAGNOSIS — Z87891 Personal history of nicotine dependence: Secondary | ICD-10-CM | POA: Insufficient documentation

## 2011-09-07 LAB — CBC
Hemoglobin: 12.2 g/dL (ref 12.0–15.0)
MCHC: 32.7 g/dL (ref 30.0–36.0)
Platelets: 230 10*3/uL (ref 150–400)
RBC: 4.51 MIL/uL (ref 3.87–5.11)

## 2011-09-07 LAB — BASIC METABOLIC PANEL
GFR calc Af Amer: >90 mL/min (ref >90)
GFR calc non Af Amer: 88 mL/min — ABNORMAL LOW (ref >90)
Potassium: 3.6 mEq/L (ref 3.5–5.1)
Sodium: 138 mEq/L (ref 135–145)

## 2011-09-07 LAB — POCT I-STAT TROPONIN I: Troponin i, poc: 0.02 ng/mL (ref 0.00–0.08)

## 2011-09-07 LAB — DIFFERENTIAL
Basophils Relative: 0 % (ref 0–1)
Monocytes Relative: 9 % (ref 3–12)
Neutro Abs: 7 10*3/uL (ref 1.7–7.7)
Neutrophils Relative %: 71 % (ref 43–77)

## 2011-09-07 MED ORDER — KETOROLAC TROMETHAMINE 60 MG/2ML IM SOLN
60.0000 mg | Freq: Once | INTRAMUSCULAR | Status: AC
  Administered 2011-09-07: 60 mg via INTRAMUSCULAR
  Filled 2011-09-07: qty 2

## 2011-09-07 NOTE — ED Notes (Signed)
Pt presents to department for evaluation of diffuse chest pain, SOB, and lower back pain. Ongoing since Friday. Pt states 6/10 chest pressure upon arrival to ED. Pt also states diaphoresis and increasing pain with movement. Skin warm and dry. Pt conscious alert and oriented x4.

## 2011-09-07 NOTE — ED Notes (Signed)
Water given to pt per request.

## 2011-09-07 NOTE — ED Notes (Signed)
NURSE FIRST ROUNDS, DELAY AND PROCESS EXPLAINED , PT. STABLE / RESPIRATIONS UNLABORED.

## 2011-09-07 NOTE — ED Notes (Signed)
Pt states cp since Friday sharp in nature with sob for 1 week. Pt speaking in full sentences without distress. Pt asking for social worker for arrangements to return home.

## 2011-09-08 MED ORDER — HYDROCODONE-ACETAMINOPHEN 5-325 MG PO TABS: 1.0000 | ORAL_TABLET | Freq: Four times a day (QID) | ORAL | Status: AC | PRN

## 2011-09-08 MED ORDER — CYCLOBENZAPRINE HCL 10 MG PO TABS: 10.0000 mg | ORAL_TABLET | Freq: Three times a day (TID) | ORAL | Status: AC | PRN

## 2011-09-08 MED ORDER — PREDNISONE 50 MG PO TABS: 50.0000 mg | ORAL_TABLET | Freq: Every day | ORAL | Status: AC

## 2011-09-08 NOTE — ED Notes (Signed)
Pt given discharge via teach back

## 2011-09-08 NOTE — Discharge Instructions (Signed)
Return here as needed.  Use ice and heat on the chest and back.  Followup with a primary care Dr. for reevaluation and recheck.

## 2011-09-08 NOTE — ED Provider Notes (Addendum)
History     CSN: [id number]  Arrival date & time 09/07/11  1717   First MD Initiated Contact with Patient 09/07/11 2158      Chief Complaint  Patient presents with  . Chest Pain    (Consider location/radiation/quality/duration/timing/severity/associated sxs/prior treatment) HPI Patient presents emergency department with bilateral chest pain.  She states she stopped smoking 3 months ago and has had intermittent bouts of shortness of breath since that time.  She states today that her back started hurting and her chest while doing laundry.  Patient denies nausea, vomiting, abdominal pain, dizziness, syncope, visual changes, fever, or weakness.  Patient did say she has some mild coughing today.  Patient denies any previous cardiac problems. Past Medical History  Diagnosis Date  . Obesity   . Recurrent upper respiratory infection (URI)     cold sx 2 wks ago   . Heartburn   . Headache     tx w/OTC meds prn  . Depression     no meds  . ADD (attention deficit disorder) without hyperactivity     no meds    Past Surgical History  Procedure Date  . No past surgeries   . Wisdom tooth extraction   . Svd     x 4 -3 epidurals and 1 spinal w/ SVD  . Laparoscopic tubal ligation 06/11/2011    Procedure: LAPAROSCOPIC TUBAL LIGATION;  Surgeon: Antionette Char, MD;  Location: [facility];  Service: Gynecology;  Laterality: N/A;    Family History  Problem Relation Age of Onset  . Diabetes Mother   . Hyperlipidemia Mother     History  Substance Use Topics  . Smoking status: Former Smoker -- 0.2 packs/day for 4 years    Types: Cigarettes    Quit date: 01/21/2011  . Smokeless tobacco: Never Used  . Alcohol Use: Yes     socially    OB History    Grav Para Term Preterm Abortions TAB SAB Ect Mult Living   4 4 4       4       Review of Systems All other systems negative except as documented in the HPI. All pertinent positives and negatives as reviewed in the HPI.  Allergies  Review  of patient's allergies indicates no known allergies.  Home Medications   Current Outpatient Rx  Name Route Sig Dispense Refill  . IBUPROFEN 600 MG PO TABS Oral Take 600 mg by mouth every 6 (six) hours as needed. Takes for pain    . PANTOPRAZOLE SODIUM 40 MG PO TBEC Oral Take 40 mg by mouth daily.      BP 118/71  Pulse 84  Temp 97.9 F (36.6 C) (Oral)  Resp 16  SpO2 100%  LMP 08/31/2011  Physical Exam  Nursing note and vitals reviewed. Constitutional: She appears well-developed and well-nourished.  HENT:  Head: Normocephalic and atraumatic.  Eyes: Pupils are equal, round, and reactive to light.  Cardiovascular: Normal rate, regular rhythm and normal heart sounds.   Pulmonary/Chest: Effort normal and breath sounds normal. No respiratory distress. She has no wheezes. She exhibits tenderness.  Abdominal: Soft. Bowel sounds are normal. She exhibits no distension.  Musculoskeletal:       Back:  Skin: Skin is warm and dry. No rash noted.    ED Course  Procedures (including critical care time)  Labs Reviewed  BASIC METABOLIC PANEL - Abnormal; Notable for the following:    Glucose, Bld 102 (*)     GFR calc non Af  Amer 46 (*)     All other components within normal limits  CBC  DIFFERENTIAL  POCT I-STAT TROPONIN I   Dg Chest 2 View  09/07/2011  *RADIOLOGY REPORT*  Clinical Data: Chest pain.  Dyspnea.  Dizziness.  Blurred vision.  CHEST - 2 VIEW  Comparison:  08/18/2010  Findings:  The heart size and mediastinal contours are within normal limits.  Both lungs are clear.  The visualized skeletal structures are unremarkable.  IMPRESSION: No active cardiopulmonary disease.  Original Report Authenticated By: Danae Orleans, M.D.    Patient is, perc negative, and has pain with movement, lasting coughing and palpation in the bilateral chest.  Pain is intermittent and is more sharp and stabbing in nature.  The atypical for cardiac disease.  Patient is advised to return here as needed.   Also advised that should followup with her primary care Dr. for further evaluation.  Her vitals remained stable here in the emergency department.  She showed no signs of significant distress.  She is given the plan and all questions were answered.    MDM    Date: 09/07/2011  Rate:85  Rhythm: normal sinus rhythm  QRS Axis: normal  Intervals: normal  ST/T Wave abnormalities: normal  Conduction Disutrbances:none  Narrative Interpretation:   Old EKG Reviewed: unchanged   MDM Reviewed: vitals and nursing note Interpretation: ECG, x-ray and labs          Carlyle Dolly, PA-C 09/08/11 0018  Carlyle Dolly, PA-C 11/22/11 [phone]

## 2011-09-10 NOTE — ED Provider Notes (Signed)
Medical screening examination/treatment/procedure(s) were performed by non-physician practitioner and as supervising physician I was immediately available for consultation/collaboration.   Loren Racer, MD 09/10/11 [phone]

## 2011-09-26 ENCOUNTER — Encounter (HOSPITAL_COMMUNITY): Payer: Self-pay | Admitting: Nurse Practitioner

## 2011-09-26 ENCOUNTER — Emergency Department (HOSPITAL_COMMUNITY)
Admission: EM | Admit: 2011-09-26 | Discharge: 2011-09-26 | Disposition: A | Discharge status: Home / Self Care | Payer: Medicaid Other | Attending: Emergency Medicine | Admitting: Emergency Medicine

## 2011-09-26 DIAGNOSIS — H9209 Otalgia, unspecified ear: Secondary | ICD-10-CM | POA: Insufficient documentation

## 2011-09-26 DIAGNOSIS — H9190 Unspecified hearing loss, unspecified ear: Secondary | ICD-10-CM | POA: Insufficient documentation

## 2011-09-26 DIAGNOSIS — R609 Edema, unspecified: Secondary | ICD-10-CM | POA: Insufficient documentation

## 2011-09-26 DIAGNOSIS — H6122 Impacted cerumen, left ear: Secondary | ICD-10-CM

## 2011-09-26 DIAGNOSIS — F988 Other specified behavioral and emotional disorders with onset usually occurring in childhood and adolescence: Secondary | ICD-10-CM | POA: Insufficient documentation

## 2011-09-26 DIAGNOSIS — H6120 Impacted cerumen, unspecified ear: Secondary | ICD-10-CM | POA: Insufficient documentation

## 2011-09-26 NOTE — ED Notes (Signed)
C/o L ear pain and "feeling clogged" since swimming Friday. Pt reports it felt like water may have gotten into it

## 2011-09-26 NOTE — ED Provider Notes (Signed)
History   This chart was scribed for Dione Booze, MD scribed by Magnus Sinning. The patient was seen in room TR08C/TR08C seen at 15:04.     CSN: [id number]  Arrival date & time 09/26/11  1358   None     Chief Complaint  Patient presents with  . Otalgia    (Consider location/radiation/quality/duration/timing/severity/associated sxs/prior treatment) HPI [name] is a 36 y.o. female who presents to the Emergency Department complaining of constant moderate left ear pain with associated clogging, and trouble hearing more prominent on the left, onset two days ago. States left ear was mildly clogged two days ago, but that it has since worsened. She rates pain a 7/10 currently and reports pain is not modified or aggravated by any factors. Explains she's tried to drain the water out, and attempted to clean the ear with a q-tip, both with no relief. Patient states she is normally in good health and says she does not smoke or drink.   Past Medical History  Diagnosis Date  . Obesity   . Recurrent upper respiratory infection (URI)     cold sx 2 wks ago   . Heartburn   . Headache     tx w/OTC meds prn  . Depression     no meds  . ADD (attention deficit disorder) without hyperactivity     no meds    Past Surgical History  Procedure Date  . Wisdom tooth extraction   . Svd     x 4 -3 epidurals and 1 spinal w/ SVD  . Laparoscopic tubal ligation 06/11/2011    Procedure: LAPAROSCOPIC TUBAL LIGATION;  Surgeon: Antionette Char, MD;  Location: [facility];  Service: Gynecology;  Laterality: N/A;    Family History  Problem Relation Age of Onset  . Diabetes Mother   . Hyperlipidemia Mother     History  Substance Use Topics  . Smoking status: Former Smoker -- 0.2 packs/day for 4 years    Types: Cigarettes    Quit date: 01/21/2011  . Smokeless tobacco: Never Used  . Alcohol Use: No    OB History    Grav Para Term Preterm Abortions TAB SAB Ect Mult Living   4 4 4       4        Review of Systems  HENT: Positive for hearing loss and ear pain.     Allergies  Review of patient's allergies indicates no known allergies.  Home Medications   Current Outpatient Rx  Name Route Sig Dispense Refill  . IBUPROFEN 600 MG PO TABS Oral Take 600 mg by mouth every 6 (six) hours as needed. Takes for pain    . PANTOPRAZOLE SODIUM 40 MG PO TBEC Oral Take 40 mg by mouth daily.      BP 105/50  Pulse 95  Temp 98.4 F (36.9 C) (Oral)  Resp 16  Ht 5\' 7"  (1.702 m)  Wt 324 lb (146.965 kg)  BMI 50.75 kg/m2  SpO2 99%  LMP 08/31/2011  Breastfeeding? Unknown  Physical Exam  Nursing note and vitals reviewed. Constitutional: She is oriented to person, place, and time. She appears well-developed and well-nourished. No distress.  HENT:  Head: Normocephalic and atraumatic.       Cerumen impaction of left ear. Large amount of cerumen in right ear.  Eyes: EOM are normal.  Neck: Neck supple. No tracheal deviation present.  Cardiovascular: Normal rate.   Pulmonary/Chest: Effort normal. No respiratory distress.  Musculoskeletal: Normal range of motion. She exhibits  edema.       1+ pitting edema  Neurological: She is alert and oriented to person, place, and time.  Skin: Skin is warm and dry.  Psychiatric: She has a normal mood and affect. Her behavior is normal.    ED Course  Procedures (including critical care time) DIAGNOSTIC STUDIES: Oxygen Saturation is 99% on room air, normal by my interpretation.    COORDINATION OF CARE: 17:25: Patient states that the left ear is still feeling clogged after nurse performed ear irrigation. EDMD notes large amount of cerumen still present in left ear.  18:40: EDMD performs recheck. Patient notes hearing has improved. EDMD reports that left TM is clear and cerumen impaction is also clear.     1. Impacted cerumen of left ear       MDM  Cerumen impaction of the left ear. Both ears are irrigated and reinspected following removal of  cerumen and TMs are noted to be clear. She notes subjective improvement.  I personally performed the services described in this documentation, which was scribed in my presence. The recorded information has been reviewed and considered.           Dione Booze, MD 09/30/11 [phone]

## 2011-09-26 NOTE — ED Notes (Signed)
Report received from Donna, RN.

## 2011-10-14 ENCOUNTER — Emergency Department (HOSPITAL_COMMUNITY)
Admission: EM | Admit: 2011-10-14 | Discharge: 2011-10-14 | Disposition: A | Discharge status: Home / Self Care | Payer: Medicaid Other | Source: Home / Self Care | Attending: Emergency Medicine | Admitting: Emergency Medicine

## 2011-10-14 ENCOUNTER — Encounter (HOSPITAL_COMMUNITY): Payer: Self-pay

## 2011-10-14 DIAGNOSIS — L02419 Cutaneous abscess of limb, unspecified: Secondary | ICD-10-CM

## 2011-10-14 DIAGNOSIS — IMO0002 Reserved for concepts with insufficient information to code with codable children: Secondary | ICD-10-CM

## 2011-10-14 HISTORY — DX: Carrier or suspected carrier of methicillin resistant Staphylococcus aureus: Z22.322

## 2011-10-14 MED ORDER — MUPIROCIN 2 % EX OINT: TOPICAL_OINTMENT | Freq: Three times a day (TID) | CUTANEOUS | Status: AC

## 2011-10-14 NOTE — ED Provider Notes (Signed)
History     CSN: [id number]  Arrival date & time 10/14/11  1526   First MD Initiated Contact with Patient 10/14/11 1549      Chief Complaint  Patient presents with  . Recurrent Skin Infections    (Consider location/radiation/quality/duration/timing/severity/associated sxs/prior treatment) HPI Comments: Patient presents urgent care today complaining of pain in the medial little boil on her right armpit. She has had other previous axillary abscesses she describes requiring incision and drainage. She wanted to come in early as she has had MRSA infections and didn't want this to become bigger in size. Denies any constitutional symptoms such as fevers, chills or changes in appetite.  The history is provided by the patient.    Past Medical History  Diagnosis Date  . Obesity   . Recurrent upper respiratory infection (URI)     cold sx 2 wks ago   . Heartburn   . Headache     tx w/OTC meds prn  . Depression     no meds  . 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 MRSA (methicillin resistant staph aureus) culture positive     Past Surgical History  Procedure Date  . Wisdom tooth extraction   . Svd     x 4 -3 epidurals and 1 spinal w/ SVD  . Laparoscopic tubal ligation 06/11/2011    Procedure: LAPAROSCOPIC TUBAL LIGATION;  Surgeon: Antionette Char, MD;  Location: [facility];  Service: Gynecology;  Laterality: N/A;    Family History  Problem Relation Age of Onset  . Diabetes Mother   . Hyperlipidemia Mother     History  Substance Use Topics  . Smoking status: Former Smoker -- 0.2 packs/day for 4 years    Types: Cigarettes    Quit date: 01/21/2011  . Smokeless tobacco: Never Used  . Alcohol Use: No    OB History    Grav Para Term Preterm Abortions TAB SAB Ect Mult Living   4 4 4       4       Review of Systems  Constitutional: Negative for chills, activity change and appetite change.  Skin: Positive for rash.    Allergies  Review of  patient's allergies indicates no known allergies.  Home Medications   Current Outpatient Rx  Name Route Sig Dispense Refill  . PANTOPRAZOLE SODIUM 40 MG PO TBEC Oral Take 40 mg by mouth daily as needed. For acid reflux    . ACETAMINOPHEN 500 MG PO TABS Oral Take 1,000 mg by mouth every 6 (six) hours as needed. For pain    . CYCLOBENZAPRINE HCL 10 MG PO TABS Oral Take 10 mg by mouth 3 (three) times daily as needed. For muscle spasms    . MUPIROCIN 2 % EX OINT Topical Apply topically 3 (three) times daily. Apply bid x 7 days 22 g 0    BP 125/86  Pulse 92  Temp 99.1 F (37.3 C) (Oral)  Resp 20  SpO2 100%  LMP 09/20/2011  Physical Exam  Nursing note reviewed. Constitutional: Vital signs are normal. She appears well-developed and well-nourished.  Non-toxic appearance. She does not have a sickly appearance. She does not appear ill. No distress.  Skin: Rash noted. Rash is pustular. There is erythema.       ED Course  INCISION AND DRAINAGE Performed by: Nadean Montanaro Authorized by: Jimmie Molly Consent: Verbal consent obtained. Consent given by: patient Patient understanding: patient states understanding of the procedure being performed  Patient identity confirmed: verbally with patient Type: abscess Body area: upper extremity Anesthesia: see MAR for details Local anesthetic: lidocaine spray Scalpel size: 11 Incision type: single straight Complexity: simple Drainage: purulent Drainage amount: scant Wound treatment: wound left open Comments: Uncomplicated minimal incision and drainage of axillary abscess   (including critical care time)  Labs Reviewed - No data to display No results found.   1. Axillary abscess       MDM  Patient with a pearly cystic/abscess formation of about 4 mm on her right axilla for 2 days. Proceeded to open with a puncture type wound with an 11 blade after using a cooling spray. Uncomplicated minor procedure        Jimmie Molly,  MD 10/14/11 2101

## 2011-10-14 NOTE — ED Notes (Signed)
C/o painful "cyst" to rt axilla for 2 days.  Area has not drained. Hx of MRSA.

## 2011-11-28 NOTE — ED Provider Notes (Signed)
Medical screening examination/treatment/procedure(s) were performed by non-physician practitioner and as supervising physician I was immediately available for consultation/collaboration.   Dione Booze, MD 11/28/11 [phone]

## 2012-02-23 ENCOUNTER — Ambulatory Visit: Payer: Medicaid Other | Admitting: Physical Therapy

## 2012-02-29 ENCOUNTER — Ambulatory Visit: Payer: Medicaid Other

## 2012-03-07 ENCOUNTER — Ambulatory Visit: Payer: Medicaid Other | Admitting: Physical Therapy

## 2012-05-08 ENCOUNTER — Emergency Department (HOSPITAL_COMMUNITY)
Admission: EM | Admit: 2012-05-08 | Discharge: 2012-05-08 | Disposition: A | Discharge status: Home / Self Care | Payer: Medicaid Other | Source: Home / Self Care | Attending: Emergency Medicine | Admitting: Emergency Medicine

## 2012-05-08 ENCOUNTER — Encounter (HOSPITAL_COMMUNITY): Payer: Self-pay | Admitting: Emergency Medicine

## 2012-05-08 DIAGNOSIS — B86 Scabies: Secondary | ICD-10-CM

## 2012-05-08 MED ORDER — SULFAMETHOXAZOLE-TRIMETHOPRIM 800-160 MG PO TABS: 1.0000 | ORAL_TABLET | Freq: Two times a day (BID) | ORAL | Status: DC

## 2012-05-08 MED ORDER — PERMETHRIN 5 % EX CREA: TOPICAL_CREAM | CUTANEOUS | Status: DC

## 2012-05-08 NOTE — ED Provider Notes (Signed)
History     CSN: [id number]  Arrival date & time 05/08/12  1656   First MD Initiated Contact with Patient 05/08/12 1741      Chief Complaint  Patient presents with  . Rash    (Consider location/radiation/quality/duration/timing/severity/associated sxs/prior treatment) Patient is a 37 y.o. female presenting with rash. The history is provided by the patient. No language interpreter was used.  Rash Location:  Full body Quality: itchiness   Severity:  Moderate Onset quality:  Gradual Duration:  4 weeks Relieved by:  Nothing Worsened by:  Nothing tried   Past Medical History  Diagnosis Date  . Obesity   . Recurrent upper respiratory infection (URI)     cold sx 2 wks ago   . Heartburn   . Headache     tx w/OTC meds prn  . Depression     no meds  . 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 MRSA (methicillin resistant staph aureus) culture positive     Past Surgical History  Procedure Laterality Date  . Wisdom tooth extraction    . Svd      x 4 -3 epidurals and 1 spinal w/ SVD  . Laparoscopic tubal ligation  06/11/2011    Procedure: LAPAROSCOPIC TUBAL LIGATION;  Surgeon: Antionette Char, MD;  Location: [facility];  Service: Gynecology;  Laterality: N/A;    Family History  Problem Relation Age of Onset  . Diabetes Mother   . Hyperlipidemia Mother     History  Substance Use Topics  . Smoking status: Former Smoker -- 0.25 packs/day for 4 years    Types: Cigarettes    Quit date: 01/21/2011  . Smokeless tobacco: Never Used  . Alcohol Use: No    OB History   Grav Para Term Preterm Abortions TAB SAB Ect Mult Living   4 4 4       4       Review of Systems  Skin: Positive for rash.  All other systems reviewed and are negative.    Allergies  Review of patient's allergies indicates no known allergies.  Home Medications   Current Outpatient Rx  Name  Route  Sig  Dispense  Refill  . acetaminophen (TYLENOL) 500 MG tablet   Oral   Take 1,000 mg by mouth every 6 (six) hours as needed. For pain         . cyclobenzaprine (FLEXERIL) 10 MG tablet   Oral   Take 10 mg by mouth 3 (three) times daily as needed. For muscle spasms         . pantoprazole (PROTONIX) 40 MG tablet   Oral   Take 40 mg by mouth daily as needed. For acid reflux         . permethrin (ELIMITE) 5 % cream      Apply to affected area once   120 g   1     BP 100/55  Pulse 86  Temp(Src) 98.3 F (36.8 C) (Oral)  Resp 20  SpO2 98%  LMP 04/10/2012  Breastfeeding? No  Physical Exam  Nursing note and vitals reviewed. Constitutional: She is oriented to person, place, and time. She appears well-developed and well-nourished.  HENT:  Head: Normocephalic.  Eyes: Pupils are equal, round, and reactive to light.  Cardiovascular: Normal rate.   Pulmonary/Chest: Effort normal.  Musculoskeletal: Normal range of motion.  Neurological: She is alert and oriented to person, place, and time.  Skin: Skin is warm. Rash noted.  Multiple burrows arms and body,  Open draining 2x2 cm wound right lower leg    ED Course  Procedures (including critical care time)  Labs Reviewed - No data to display No results found.   1. Scabies       MDM  elemite and bactrim       Lonia Skinner [place], [place] 05/08/12 1826

## 2012-05-08 NOTE — ED Notes (Signed)
Pt is c/o poss scabies x1 month Rash located: arms, legs, back, chest, neck Denies: f/v/n/d Was treated a month ago for scabies along w/family  She is alert w/no signs of acute distress.

## 2012-05-08 NOTE — ED Provider Notes (Signed)
Medical screening examination/treatment/procedure(s) were performed by non-physician practitioner and as supervising physician I was immediately available for consultation/collaboration.  Leslee Home, M.D.  Reuben Likes, MD 05/08/12 2135

## 2012-07-21 ENCOUNTER — Encounter (HOSPITAL_COMMUNITY): Payer: Self-pay | Admitting: Emergency Medicine

## 2012-07-21 ENCOUNTER — Emergency Department (HOSPITAL_COMMUNITY)
Admission: EM | Admit: 2012-07-21 | Discharge: 2012-07-21 | Disposition: A | Discharge status: Home / Self Care | Payer: Medicaid Other | Source: Home / Self Care | Attending: Emergency Medicine | Admitting: Emergency Medicine

## 2012-07-21 DIAGNOSIS — S335XXA Sprain of ligaments of lumbar spine, initial encounter: Secondary | ICD-10-CM

## 2012-07-21 DIAGNOSIS — S39012A Strain of muscle, fascia and tendon of lower back, initial encounter: Secondary | ICD-10-CM

## 2012-07-21 LAB — POCT URINALYSIS DIP (DEVICE)
Ketones, ur: NEGATIVE mg/dL
Protein, ur: NEGATIVE mg/dL
Specific Gravity, Urine: 1.02 (ref 1.005–1.030)
pH: 7 (ref 5.0–8.0)

## 2012-07-21 MED ORDER — DICLOFENAC SODIUM 75 MG PO TBEC: 75.0000 mg | DELAYED_RELEASE_TABLET | Freq: Two times a day (BID) | ORAL | Status: DC

## 2012-07-21 MED ORDER — OXYCODONE-ACETAMINOPHEN 5-325 MG PO TABS: ORAL_TABLET | ORAL | Status: DC

## 2012-07-21 MED ORDER — IBUPROFEN 800 MG PO TABS
800.0000 mg | ORAL_TABLET | Freq: Once | ORAL | Status: AC
  Administered 2012-07-21: 800 mg via ORAL

## 2012-07-21 MED ORDER — IBUPROFEN 800 MG PO TABS
ORAL_TABLET | ORAL | Status: AC
  Filled 2012-07-21: qty 1

## 2012-07-21 MED ORDER — CYCLOBENZAPRINE HCL 5 MG PO TABS
5.0000 mg | ORAL_TABLET | Freq: Three times a day (TID) | ORAL | Status: DC | PRN
Start: 2012-07-21 — End: 2013-02-05

## 2012-07-21 NOTE — ED Notes (Signed)
Pt c/o lower back / right sided pain x 3 days. Denies any injury or strenuous activity.  Denies urinary symptoms. Pt has used otc meds for pain with no relief.
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
## 2013-01-09 ENCOUNTER — Encounter (HOSPITAL_COMMUNITY): Payer: Self-pay | Admitting: Emergency Medicine

## 2013-01-09 ENCOUNTER — Emergency Department (INDEPENDENT_AMBULATORY_CARE_PROVIDER_SITE_OTHER)
Admission: EM | Admit: 2013-01-09 | Discharge: 2013-01-09 | Disposition: A | Discharge status: Home / Self Care | Payer: Medicaid Other | Source: Home / Self Care | Attending: Family Medicine | Admitting: Family Medicine

## 2013-01-09 DIAGNOSIS — H60399 Other infective otitis externa, unspecified ear: Secondary | ICD-10-CM

## 2013-01-09 DIAGNOSIS — H6091 Unspecified otitis externa, right ear: Secondary | ICD-10-CM

## 2013-01-09 MED ORDER — NEOMYCIN-POLYMYXIN-HC 3.5-10000-1 OT SUSP: 4.0000 [drp] | Freq: Three times a day (TID) | OTIC | Status: DC

## 2013-01-09 MED ORDER — IBUPROFEN 200 MG PO TABS: 200.0000 mg | ORAL_TABLET | Freq: Four times a day (QID) | ORAL | Status: DC | PRN

## 2013-01-09 NOTE — ED Provider Notes (Signed)
CSN: [id number]     Arrival date & time 01/09/13  1728 History   First MD Initiated Contact with Patient 01/09/13 1907     No chief complaint on file.  (Consider location/radiation/quality/duration/timing/severity/associated sxs/prior Treatment) HPI Comments: 37 yo female with ear pain x 2 days. Originally thought something was in her ear and tried to use peroxide to wash it out but noticed symptoms were increasing with pain instead of improving. No history of recent cold/ allergy increase or unusual exposures.    Past Medical History  Diagnosis Date  . Obesity   . Recurrent upper respiratory infection (URI)     cold sx 2 wks ago   . Heartburn   . Headache(784.0)     tx w/OTC meds prn  . Depression     no meds  . 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 MRSA (methicillin resistant staph aureus) culture positive    Past Surgical History  Procedure Laterality Date  . Wisdom tooth extraction    . Svd      x 4 -3 epidurals and 1 spinal w/ SVD  . Laparoscopic tubal ligation  06/11/2011    Procedure: LAPAROSCOPIC TUBAL LIGATION;  Surgeon: Antionette Char, MD;  Location: [facility];  Service: Gynecology;  Laterality: N/A;   Family History  Problem Relation Age of Onset  . Diabetes Mother   . Hyperlipidemia Mother    History  Substance Use Topics  . Smoking status: Former Smoker -- 0.25 packs/day for 4 years    Types: Cigarettes    Quit date: 01/21/2011  . Smokeless tobacco: Never Used  . Alcohol Use: No   OB History   Grav Para Term Preterm Abortions TAB SAB Ect Mult Living   4 4 4       4      Review of Systems  Constitutional: Negative.   HENT: Positive for ear pain. Negative for ear discharge and hearing loss.   Respiratory: Negative.   Cardiovascular: Negative.   Neurological: Negative.     Allergies  Review of patient's allergies indicates no known allergies.  Home Medications   Current Outpatient Rx  Name  Route  Sig  Dispense   Refill  . acetaminophen (TYLENOL) 500 MG tablet   Oral   Take 1,000 mg by mouth every 6 (six) hours as needed. For pain         . AMOXICILLIN PO   Oral   Take by mouth.         . cyclobenzaprine (FLEXERIL) 10 MG tablet   Oral   Take 10 mg by mouth 3 (three) times daily as needed. For muscle spasms         . cyclobenzaprine (FLEXERIL) 5 MG tablet   Oral   Take 1 tablet (5 mg total) by mouth 3 (three) times daily as needed for muscle spasms.   30 tablet   0   . diclofenac (VOLTAREN) 75 MG EC tablet   Oral   Take 1 tablet (75 mg total) by mouth 2 (two) times daily.   20 tablet   0   . oxyCODONE-acetaminophen (PERCOCET) 5-325 MG per tablet      1 to 2 tablets every 6 hours as needed for pain.   20 tablet   0   . pantoprazole (PROTONIX) 40 MG tablet   Oral   Take 40 mg by mouth daily as needed. For acid reflux         .  permethrin (ELIMITE) 5 % cream      Apply to affected area once   120 g   1   . sulfamethoxazole-trimethoprim (SEPTRA DS) 800-160 MG per tablet   Oral   Take 1 tablet by mouth 2 (two) times daily.   20 tablet   0    BP 102/60  Pulse 90  Temp(Src) 98.3 F (36.8 C) (Oral)  Resp 20  SpO2 96% Physical Exam  Nursing note and vitals reviewed. Constitutional: She is oriented to person, place, and time. She appears well-developed and well-nourished.  HENT:  Head: Normocephalic and atraumatic.  Right Ear: External ear normal.  Left Ear: External ear normal.  Nose: Nose normal.  Mouth/Throat: Oropharynx is clear and moist.  Right EAC with edematous emaciated appearance. Canal almost swollen shut unable to visualize TM.  Cardiovascular: Normal rate, regular rhythm, normal heart sounds and intact distal pulses.   Pulmonary/Chest: Effort normal and breath sounds normal.  Musculoskeletal: Normal range of motion.  Neurological: She is alert and oriented to person, place, and time.  Skin: Skin is warm and dry.  Psychiatric: Judgment normal.     ED Course  Procedures (including critical care time) Labs Review Labs Reviewed - No data to display Imaging Review No results found.    MDM   Right Otitis externa. Ibuprofen 600mg  tid #20 PRN pain. Cortisporin OTIC AD. F/U pCP if no improvement.   Berenice Primas, PA-C 01/09/13 1951

## 2013-01-09 NOTE — ED Notes (Signed)
Right ear pain onset yesterday. Post nasal drip. Denies fever and any other symptoms.

## 2013-01-10 NOTE — ED Provider Notes (Signed)
Medical screening examination/treatment/procedure(s) were performed by resident physician or non-physician practitioner and as supervising physician I was immediately available for consultation/collaboration.   Ayako Tapanes DOUGLAS MD.   Ovadia Lopp D Alphus Zeck, MD 01/10/13 2112 

## 2013-02-05 ENCOUNTER — Emergency Department (HOSPITAL_COMMUNITY)
Admission: EM | Admit: 2013-02-05 | Discharge: 2013-02-05 | Disposition: A | Discharge status: Home / Self Care | Payer: Medicaid Other | Attending: Emergency Medicine | Admitting: Emergency Medicine

## 2013-02-05 ENCOUNTER — Encounter (HOSPITAL_COMMUNITY): Payer: Self-pay | Admitting: Emergency Medicine

## 2013-02-05 DIAGNOSIS — Z8614 Personal history of Methicillin resistant Staphylococcus aureus infection: Secondary | ICD-10-CM | POA: Insufficient documentation

## 2013-02-05 DIAGNOSIS — Z8719 Personal history of other diseases of the digestive system: Secondary | ICD-10-CM | POA: Insufficient documentation

## 2013-02-05 DIAGNOSIS — E669 Obesity, unspecified: Secondary | ICD-10-CM | POA: Insufficient documentation

## 2013-02-05 DIAGNOSIS — Z87891 Personal history of nicotine dependence: Secondary | ICD-10-CM | POA: Insufficient documentation

## 2013-02-05 DIAGNOSIS — L0291 Cutaneous abscess, unspecified: Secondary | ICD-10-CM

## 2013-02-05 DIAGNOSIS — F988 Other specified behavioral and emotional disorders with onset usually occurring in childhood and adolescence: Secondary | ICD-10-CM | POA: Insufficient documentation

## 2013-02-05 DIAGNOSIS — F3289 Other specified depressive episodes: Secondary | ICD-10-CM | POA: Insufficient documentation

## 2013-02-05 DIAGNOSIS — Z8669 Personal history of other diseases of the nervous system and sense organs: Secondary | ICD-10-CM | POA: Insufficient documentation

## 2013-02-05 DIAGNOSIS — F329 Major depressive disorder, single episode, unspecified: Secondary | ICD-10-CM | POA: Insufficient documentation

## 2013-02-05 DIAGNOSIS — L02419 Cutaneous abscess of limb, unspecified: Secondary | ICD-10-CM | POA: Insufficient documentation

## 2013-02-05 MED ORDER — TRAMADOL HCL 50 MG PO TABS: 50.0000 mg | ORAL_TABLET | Freq: Four times a day (QID) | ORAL | Status: DC | PRN

## 2013-02-05 MED ORDER — SULFAMETHOXAZOLE-TRIMETHOPRIM 800-160 MG PO TABS: 1.0000 | ORAL_TABLET | Freq: Two times a day (BID) | ORAL | Status: DC

## 2013-02-05 MED ORDER — SULFAMETHOXAZOLE-TMP DS 800-160 MG PO TABS
1.0000 | ORAL_TABLET | Freq: Once | ORAL | Status: AC
  Administered 2013-02-05: 1 via ORAL
  Filled 2013-02-05: qty 1

## 2013-02-05 MED ORDER — TRAMADOL HCL 50 MG PO TABS
50.0000 mg | ORAL_TABLET | Freq: Once | ORAL | Status: AC
  Administered 2013-02-05: 50 mg via ORAL
  Filled 2013-02-05: qty 1

## 2013-02-05 NOTE — ED Provider Notes (Signed)
CSN: [id number]     Arrival date & time 02/05/13  1421 History  This chart was scribed for non-physician practitioner Wynetta Emery, PA-C working with Ethelda Chick, MD by Leone Payor, ED Scribe. This patient was seen in room TR06C/TR06C and the patient's care was started at 1421.    Chief Complaint  Patient presents with  . Leg Pain    The history is provided by the patient. No language interpreter was used.    HPI Comments: [name] is a 37 y.o. female who presents to the Emergency Department complaining of 3 days of gradual onset, gradually worsening, constant redness and pain to the lateral left leg. Pt states she had an old sore there that she believes she may have irritated. Pt states she has a history of MRSA. Pt states there was some drainage earlier today. She denies fever, nausea, vomiting.    Past Medical History  Diagnosis Date  . Obesity   . Recurrent upper respiratory infection (URI)     cold sx 2 wks ago   . Heartburn   . Headache(784.0)     tx w/OTC meds prn  . Depression     no meds  . 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 MRSA (methicillin resistant staph aureus) culture positive    Past Surgical History  Procedure Laterality Date  . Wisdom tooth extraction    . Svd      x 4 -3 epidurals and 1 spinal w/ SVD  . Laparoscopic tubal ligation  06/11/2011    Procedure: LAPAROSCOPIC TUBAL LIGATION;  Surgeon: Antionette Char, MD;  Location: [facility];  Service: Gynecology;  Laterality: N/A;   Family History  Problem Relation Age of Onset  . Diabetes Mother   . Hyperlipidemia Mother    History  Substance Use Topics  . Smoking status: Former Smoker -- 0.25 packs/day for 4 years    Types: Cigarettes    Quit date: 01/21/2011  . Smokeless tobacco: Never Used  . Alcohol Use: No   OB History   Grav Para Term Preterm Abortions TAB SAB Ect Mult Living   4 4 4       4      Review of Systems  Skin: Positive for wound  (lateral left leg).  Neurological: Negative for numbness.    Allergies  Review of patient's allergies indicates no known allergies.  Home Medications   Current Outpatient Rx  Name  Route  Sig  Dispense  Refill  . ibuprofen (ADVIL,MOTRIN) 200 MG tablet   Oral   Take 400 mg by mouth every 6 (six) hours as needed for headache.         . sulfamethoxazole-trimethoprim (SEPTRA DS) 800-160 MG per tablet   Oral   Take 1 tablet by mouth every 12 (twelve) hours.   14 tablet   0   . traMADol (ULTRAM) 50 MG tablet   Oral   Take 1 tablet (50 mg total) by mouth every 6 (six) hours as needed.   15 tablet   0    BP 127/82  Pulse 86  Temp(Src) 97.8 F (36.6 C) (Oral)  Resp 18  SpO2 99% Physical Exam  Nursing note and vitals reviewed. Constitutional: She is oriented to person, place, and time. She appears well-developed and well-nourished. No distress.  HENT:  Head: Normocephalic and atraumatic.  Eyes: Conjunctivae and EOM are normal. Pupils are equal, round, and reactive to light.  Neck: Normal range  of motion.  Cardiovascular: Normal rate and regular rhythm.   Pulmonary/Chest: Effort normal and breath sounds normal. No stridor. No respiratory distress. She has no wheezes. She has no rales. She exhibits no tenderness.  Abdominal: Soft. Bowel sounds are normal. She exhibits no distension and no mass. There is no tenderness. There is no rebound and no guarding.  Musculoskeletal: Normal range of motion. She exhibits no edema.  Neurological: She is alert and oriented to person, place, and time.  Skin:  1 cm indurated abscess to left lateral leg. No fluctuance or discharge   Psychiatric: She has a normal mood and affect.    ED Course  Procedures   DIAGNOSTIC STUDIES: Oxygen Saturation is 98% on RA, normal by my interpretation.    COORDINATION OF CARE: 3:25 PM Discussed treatment plan with pt at bedside and pt agreed to plan.   Labs Review Labs Reviewed - No data to  display Imaging Review No results found.  EKG Interpretation   None       MDM   1. Abscess     Filed Vitals:   02/05/13 1424 02/05/13 1552  BP: 103/71 127/82  Pulse: 90 86  Temp: 98.6 F (37 C) 97.8 F (36.6 C)  TempSrc: Oral Oral  Resp: 20 18  SpO2: 98% 99%     [name] is a 37 y.o. female with early abscess to left leg. Patient defers I&D. We'll start her on Bactrim and encourage warm compresses.  Medications  traMADol (ULTRAM) tablet 50 mg (50 mg Oral Given 02/05/13 1524)  sulfamethoxazole-trimethoprim (BACTRIM DS) 800-160 MG per tablet 1 tablet (1 tablet Oral Given 02/05/13 1524)    Pt is hemodynamically stable, appropriate for, and amenable to discharge at this time. Pt verbalized understanding and agrees with care plan. All questions answered. Outpatient follow-up and specific return precautions discussed.    Discharge Medication List as of 02/05/2013  3:29 PM    START taking these medications   Details  sulfamethoxazole-trimethoprim (SEPTRA DS) 800-160 MG per tablet Take 1 tablet by mouth every 12 (twelve) hours., Starting 02/05/2013, Until Discontinued, Print    traMADol (ULTRAM) 50 MG tablet Take 1 tablet (50 mg total) by mouth every 6 (six) hours as needed., Starting 02/05/2013, Until Discontinued, Print        Note: Portions of this report may have been transcribed using voice recognition software. Every effort was made to ensure accuracy; however, inadvertent computerized transcription errors may be present    Wynetta Emery, PA-C 02/10/13 1038

## 2013-02-05 NOTE — ED Notes (Signed)
Pt states she had a healed sore on left lower leg. States it became red and draining pus yesterday. Pt has hx of MRSA.

## 2013-02-05 NOTE — ED Notes (Signed)
Pt with sore to left leg with small amount of redness around area x 3 days

## 2013-02-14 NOTE — ED Provider Notes (Signed)
Medical screening examination/treatment/procedure(s) were performed by non-physician practitioner and as supervising physician I was immediately available for consultation/collaboration.  EKG Interpretation   None        Kathy Wares K Linker, MD 02/14/13 1509 

## 2013-03-03 IMAGING — US US FETAL BPP W/O NONSTRESS
1 series · 13 of 23 positions shown · non-contrast
Comparison: none

[Series 1: us fetal bpp w/o nonstress · non-contrast · 23 acquisitions, 13 frames shown]
[im 1/23]
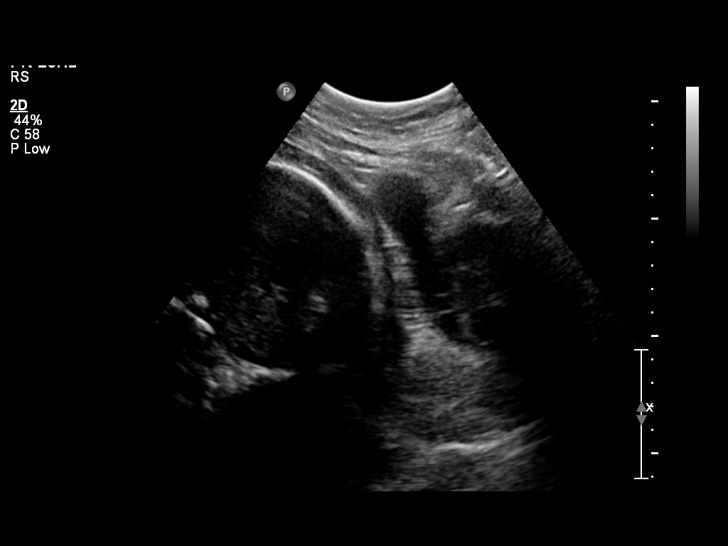
[im 3/23]
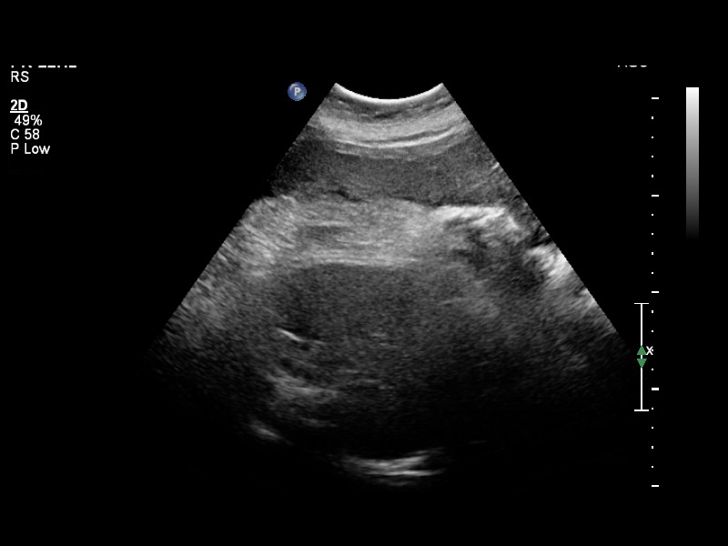
[im 5/23]
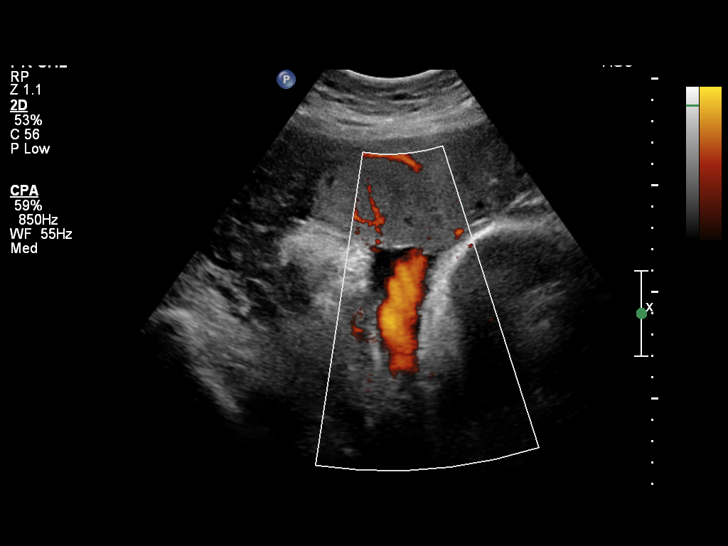
[im 7/23]
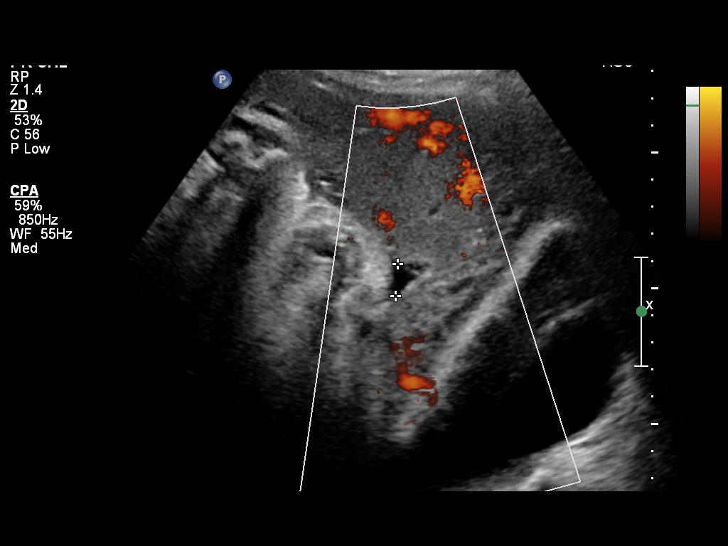
[im 8/23]
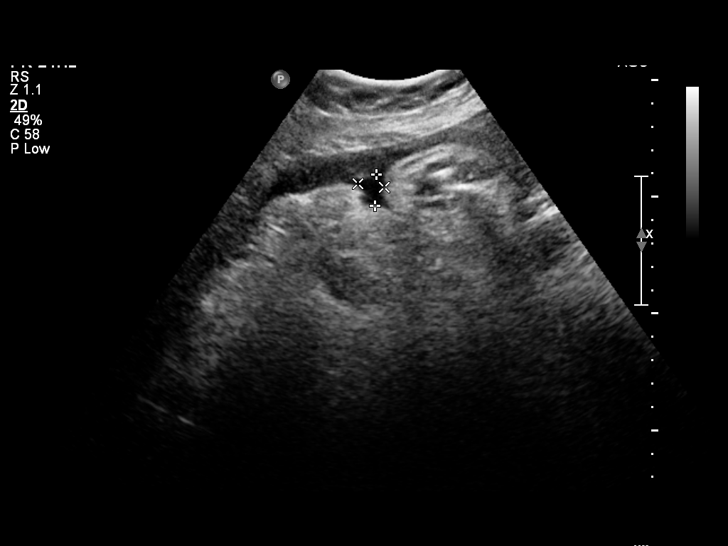
[im 10/23]
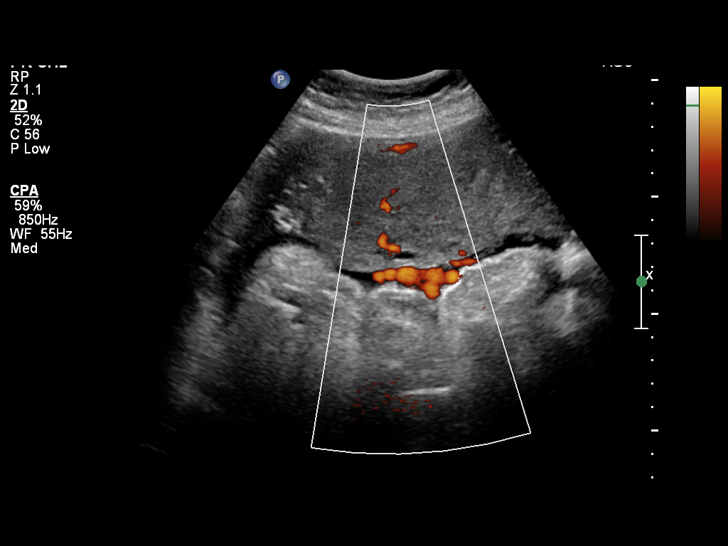
[im 12/23]
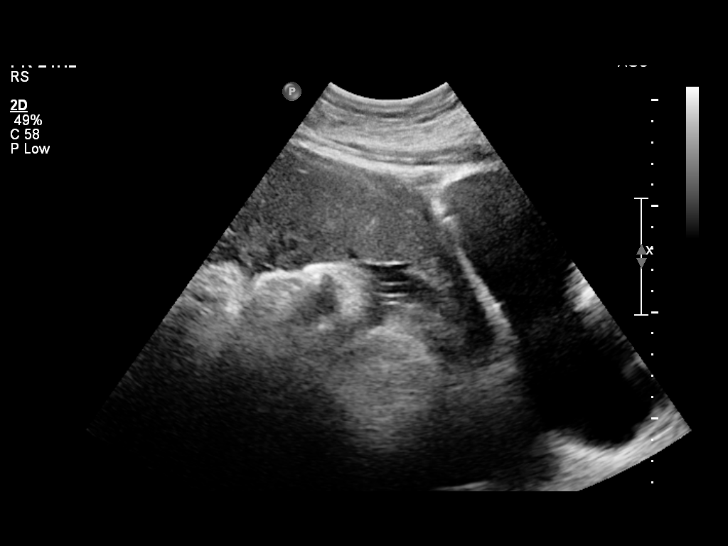
[im 14/23]
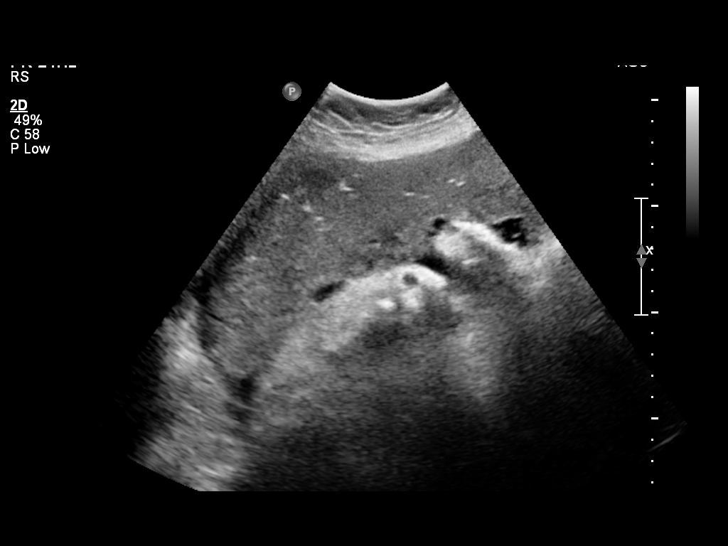
[im 16/23]
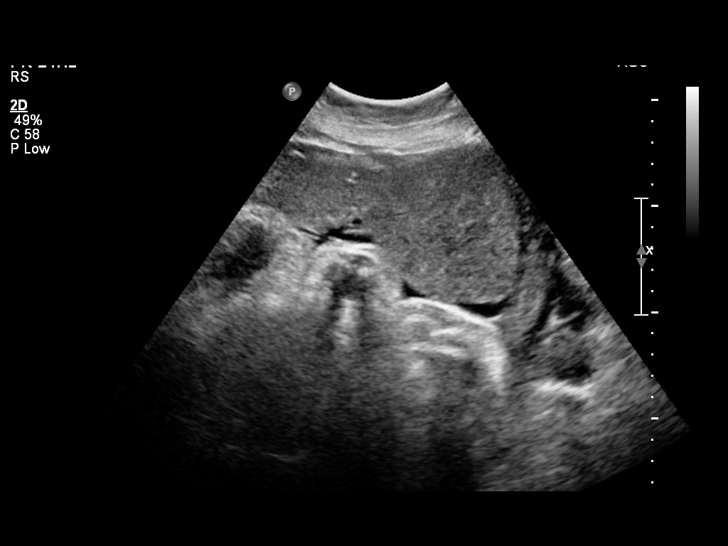
[im 17/23]
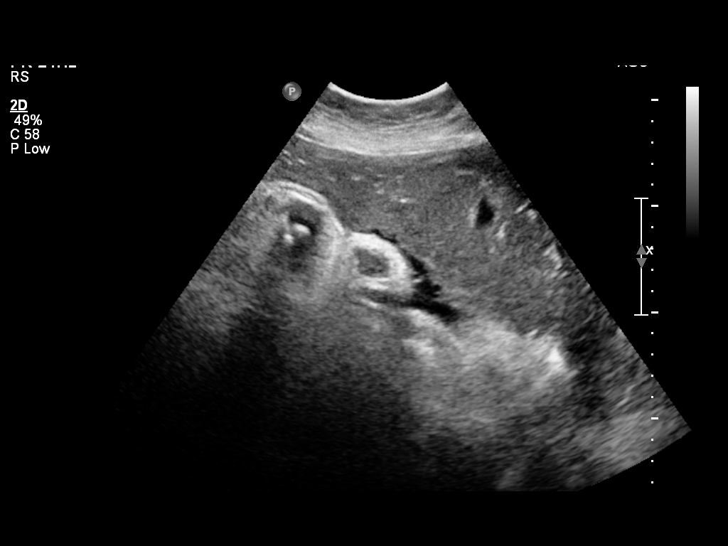
[im 19/23]
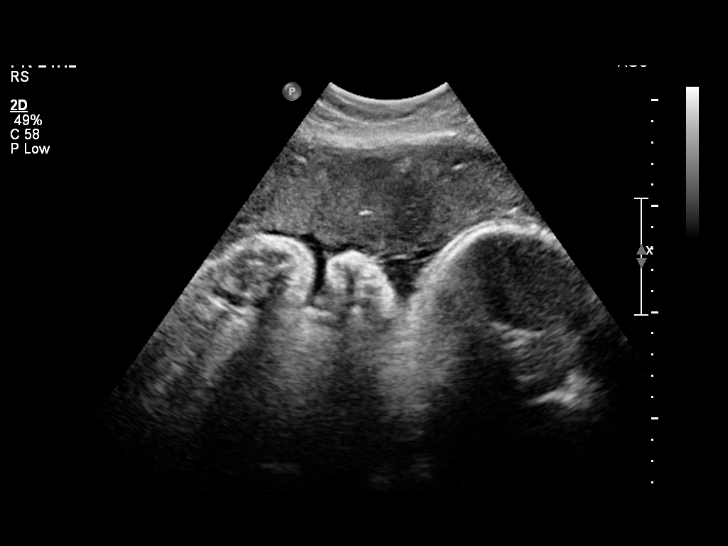
[im 21/23]
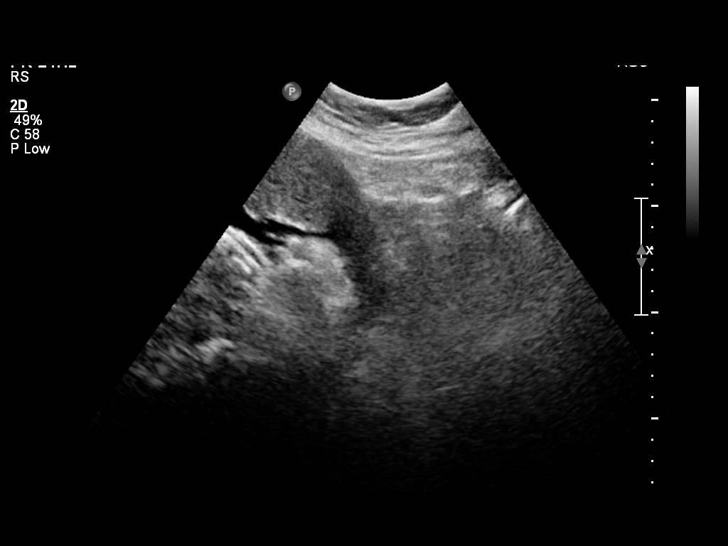
[im 23/23]
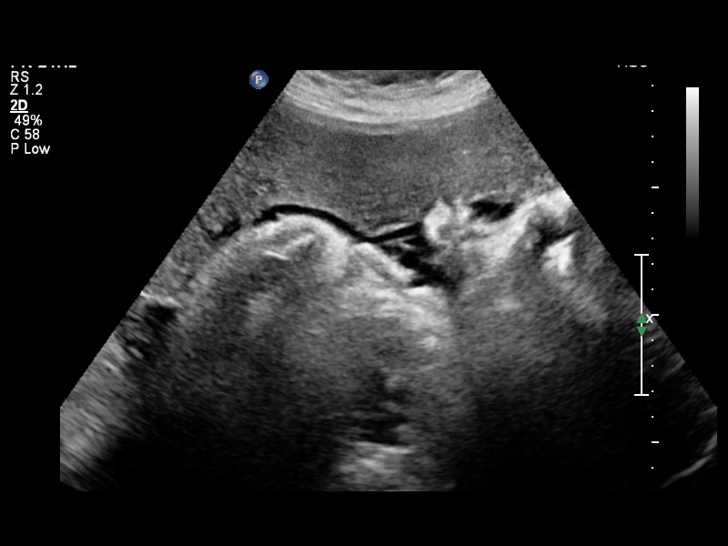

[13 of 23 positions shown; findings below may reference images not displayed]

OBSTETRICS REPORT
                      (Signed Final 04/09/2011 [DATE])

Procedures

 [HOSPITAL]                                         [address].0
Indications

 Non-reactive NST
Fetal Evaluation

 Fetal Heart Rate:  143                         bpm
 Cardiac Activity:  Observed
 Presentation:      Cephalic
 Placenta:          Anterior, above cervical os

 Comment:    BP time = 5 minutes.

 Amniotic Fluid
 AFI FV:      Oligohydramnios
 AFI Sum:     2.51    cm      < 3  %Tile     Larg Pckt:   1.33   cm
 RUQ:   1.33   cm    LLQ:    1.18   cm
Biophysical Evaluation

 Amniotic F.V:   Pocket < 2 cm two          F. Tone:        Observed
                 planes
 F. Movement:    Observed                   Score:          [DATE]
 F. Breathing:   Observed
Gestational Age

 Clinical EDD:  39w 4d                                        EDD:   04/12/11
 Best:          39w 4d    Det. By:   Clinical EDD             EDD:   04/12/11
Cervix Uterus Adnexa

 Cervix:       Not visualized (evaluated GA >29 wks)
 Uterus:       No abnormality visualized.
 Cul De Sac:   No free fluid seen.
 Left Ovary:   Not visualized.
 Right Ovary:  Not visualized.

 Adnexa:     No abnormality visualized.
Impression

 Single living IUP with assigned GA of 39w 4d in cephalic
 position.
 Oligohydramnios. AFI is 2.5 cm.
 BPP is [DATE]. A zero was scored for amniotic fluid.

 I called this report to the [facility] at [DATE] 04/09/2011.
Recommendations

 ]

 questions or concerns.

## 2013-04-05 ENCOUNTER — Observation Stay (HOSPITAL_COMMUNITY)
Admission: EM | Admit: 2013-04-05 | Discharge: 2013-04-07 | Disposition: A | Discharge status: Home / Self Care | Payer: Medicaid Other | Attending: Surgery | Admitting: Surgery

## 2013-04-05 ENCOUNTER — Encounter (HOSPITAL_COMMUNITY): Payer: Self-pay | Admitting: Emergency Medicine

## 2013-04-05 DIAGNOSIS — F988 Other specified behavioral and emotional disorders with onset usually occurring in childhood and adolescence: Secondary | ICD-10-CM | POA: Insufficient documentation

## 2013-04-05 DIAGNOSIS — K219 Gastro-esophageal reflux disease without esophagitis: Secondary | ICD-10-CM | POA: Insufficient documentation

## 2013-04-05 DIAGNOSIS — Z7982 Long term (current) use of aspirin: Secondary | ICD-10-CM | POA: Insufficient documentation

## 2013-04-05 DIAGNOSIS — Z23 Encounter for immunization: Secondary | ICD-10-CM | POA: Insufficient documentation

## 2013-04-05 DIAGNOSIS — K3580 Unspecified acute appendicitis: Principal | ICD-10-CM | POA: Insufficient documentation

## 2013-04-05 DIAGNOSIS — K37 Unspecified appendicitis: Secondary | ICD-10-CM | POA: Diagnosis present

## 2013-04-05 DIAGNOSIS — E669 Obesity, unspecified: Secondary | ICD-10-CM | POA: Insufficient documentation

## 2013-04-05 LAB — CBC WITH DIFFERENTIAL/PLATELET
Basophils Absolute: 0 10*3/uL (ref 0.0–0.1)
Basophils Relative: 0 % (ref 0–1)
EOS ABS: 0.2 10*3/uL (ref 0.0–0.7)
Eosinophils Relative: 2 % (ref 0–5)
HEMATOCRIT: 39.5 % (ref 36.0–46.0)
HEMOGLOBIN: 13.6 g/dL (ref 12.0–15.0)
LYMPHS ABS: 1.9 10*3/uL (ref 0.7–4.0)
Lymphocytes Relative: 14 % (ref 12–46)
MCH: 28.9 pg (ref 26.0–34.0)
MCHC: 34.4 g/dL (ref 30.0–36.0)
MCV: 84 fL (ref 78.0–100.0)
MONO ABS: 0.9 10*3/uL (ref 0.1–1.0)
MONOS PCT: 7 % (ref 3–12)
NEUTROS PCT: 77 % (ref 43–77)
Neutro Abs: 10.4 10*3/uL — ABNORMAL HIGH (ref 1.7–7.7)
Platelets: 240 10*3/uL (ref 150–400)
RBC: 4.7 MIL/uL (ref 3.87–5.11)
RDW: 13.2 % (ref 11.5–15.5)
WBC: 13.5 10*3/uL — ABNORMAL HIGH (ref 4.0–10.5)

## 2013-04-05 LAB — POCT PREGNANCY, URINE: PREG TEST UR: NEGATIVE

## 2013-04-05 LAB — COMPREHENSIVE METABOLIC PANEL
ALBUMIN: 3.9 g/dL (ref 3.5–5.2)
ALT: 17 U/L (ref 0–35)
AST: 18 U/L (ref 0–37)
Alkaline Phosphatase: 85 U/L (ref 39–117)
BUN: 14 mg/dL (ref 6–23)
CALCIUM: 9.3 mg/dL (ref 8.4–10.5)
CO2: 27 meq/L (ref 19–32)
CREATININE: 0.85 mg/dL (ref 0.50–1.10)
Chloride: 101 mEq/L (ref 96–112)
GFR calc Af Amer: >90 mL/min (ref >90)
GFR, EST NON AFRICAN AMERICAN: 87 mL/min — AB (ref >90)
Glucose, Bld: 90 mg/dL (ref 70–99)
Potassium: 4 mEq/L (ref 3.7–5.3)
SODIUM: 140 meq/L (ref 137–147)
TOTAL PROTEIN: 8.1 g/dL (ref 6.0–8.3)
Total Bilirubin: 0.5 mg/dL (ref 0.3–1.2)

## 2013-04-05 LAB — URINALYSIS, ROUTINE W REFLEX MICROSCOPIC
BILIRUBIN URINE: NEGATIVE
GLUCOSE, UA: NEGATIVE mg/dL
HGB URINE DIPSTICK: NEGATIVE
Ketones, ur: NEGATIVE mg/dL
Nitrite: NEGATIVE
Protein, ur: NEGATIVE mg/dL
SPECIFIC GRAVITY, URINE: 1.023 (ref 1.005–1.030)
Urobilinogen, UA: 0.2 mg/dL (ref 0.0–1.0)
pH: 7 (ref 5.0–8.0)

## 2013-04-05 LAB — URINE MICROSCOPIC-ADD ON

## 2013-04-05 MED ORDER — ONDANSETRON HCL 4 MG/2ML IJ SOLN
4.0000 mg | Freq: Once | INTRAMUSCULAR | Status: AC
  Administered 2013-04-06: 4 mg via INTRAVENOUS
  Filled 2013-04-05: qty 2

## 2013-04-05 MED ORDER — SODIUM CHLORIDE 0.9 % IV SOLN
INTRAVENOUS | Status: DC
  Administered 2013-04-06: 125 mL/h via INTRAVENOUS

## 2013-04-05 MED ORDER — FENTANYL CITRATE 0.05 MG/ML IJ SOLN
50.0000 ug | INTRAMUSCULAR | Status: DC | PRN
  Administered 2013-04-06: 50 ug via INTRAVENOUS
  Filled 2013-04-05: qty 2

## 2013-04-05 NOTE — ED Notes (Addendum)
Pt reports sudden onset RLQ abd pain approx 1 hour ago. Reports nausea. Denies vomiting, bowel/bladder changes. Pt reports hx of tubal but has not had period since 05/2012.

## 2013-04-05 NOTE — ED Provider Notes (Signed)
CSN: [id number]     Arrival date & time 04/05/13  1718 History   First MD Initiated Contact with Patient 04/05/13 2351     Chief Complaint  Patient presents with  . Abdominal Pain   (Consider location/radiation/quality/duration/timing/severity/associated sxs/prior Treatment) HPI Hx per patient. Abdominal pain onset around 4 PM is sharp in quality, constant and worse with movement. Symptoms moderate severity. Has nausea but no vomiting or diarrhea. No fevers or chills. No history of same. Pain does not radiate. No back pain. No chest pain. No shortness of breath. No vaginal bleeding or discharge. Past Medical History  Diagnosis Date  . Obesity   . Recurrent upper respiratory infection (URI)     cold sx 2 wks ago   . Heartburn   . Headache(784.0)     tx w/OTC meds prn  . Depression     no meds  . 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 MRSA (methicillin resistant staph aureus) culture positive    Past Surgical History  Procedure Laterality Date  . Wisdom tooth extraction    . Svd      x 4 -3 epidurals and 1 spinal w/ SVD  . Laparoscopic tubal ligation  06/11/2011    Procedure: LAPAROSCOPIC TUBAL LIGATION;  Surgeon: Antionette Char, MD;  Location: [facility];  Service: Gynecology;  Laterality: N/A;   Family History  Problem Relation Age of Onset  . Diabetes Mother   . Hyperlipidemia Mother    History  Substance Use Topics  . Smoking status: Former Smoker -- 0.25 packs/day for 4 years    Types: Cigarettes    Quit date: 01/21/2011  . Smokeless tobacco: Never Used  . Alcohol Use: No   OB History   Grav Para Term Preterm Abortions TAB SAB Ect Mult Living   4 4 4       4      Review of Systems  Constitutional: Negative for fever and chills.  Eyes: Negative for visual disturbance.  Respiratory: Negative for shortness of breath.   Cardiovascular: Negative for chest pain.  Gastrointestinal: Positive for abdominal pain.  Genitourinary: Negative  for dysuria.  Musculoskeletal: Negative for back pain, neck pain and neck stiffness.  Skin: Negative for rash.  Neurological: Negative for headaches.  All other systems reviewed and are negative.    Allergies  Review of patient's allergies indicates no known allergies.  Home Medications   Current Outpatient Rx  Name  Route  Sig  Dispense  Refill  . acetaminophen (TYLENOL) 500 MG tablet   Oral   Take 1,000 mg by mouth every 6 (six) hours as needed for moderate pain.         Marland Kitchen aspirin 325 MG tablet   Oral   Take 325 mg by mouth every 6 (six) hours as needed for moderate pain.          BP 111/64  Pulse 87  Temp(Src) 97.8 F (36.6 C) (Oral)  Resp 18  Ht 5\' 7"  (1.702 m)  Wt 320 lb (145.151 kg)  BMI 50.11 kg/m2  SpO2 98% Physical Exam  Constitutional: She is oriented to person, place, and time. She appears well-developed and well-nourished.  HENT:  Head: Normocephalic and atraumatic.  Eyes: EOM are normal. Pupils are equal, round, and reactive to light.  Neck: Neck supple.  Cardiovascular: Normal rate, regular rhythm and intact distal pulses.   Pulmonary/Chest: Effort normal and breath sounds normal. No respiratory distress.  Abdominal: Soft. Bowel  sounds are normal. She exhibits no distension. There is no rebound.  Obese with tenderness over the right lower quadrant and some voluntary guarding. Negative Murphy's sign. minimal right upper quadrant tenderness.  Musculoskeletal: Normal range of motion. She exhibits no edema.  Neurological: She is alert and oriented to person, place, and time.  Skin: Skin is warm and dry.    ED Course  Procedures (including critical care time) Labs Review Labs Reviewed  COMPREHENSIVE METABOLIC PANEL - Abnormal; Notable for the following:    GFR calc non Af Amer 87 (*)    All other components within normal limits  CBC WITH DIFFERENTIAL - Abnormal; Notable for the following:    WBC 13.5 (*)    Neutro Abs 10.4 (*)    All other  components within normal limits  URINALYSIS, ROUTINE W REFLEX MICROSCOPIC - Abnormal; Notable for the following:    APPearance TURBID (*)    Leukocytes, UA SMALL (*)    All other components within normal limits  URINE MICROSCOPIC-ADD ON  POCT PREGNANCY, URINE   Imaging Review Ct Abdomen Pelvis W Contrast  04/06/2013   CLINICAL DATA:  Right lower quadrant pain  EXAM: CT ABDOMEN AND PELVIS WITH CONTRAST  TECHNIQUE: Multidetector CT imaging of the abdomen and pelvis was performed using the standard protocol following bolus administration of intravenous contrast.  CONTRAST:  100mL OMNIPAQUE IOHEXOL 300 MG/ML  SOLN  COMPARISON:  None.  FINDINGS: BODY WALL: Unremarkable.  LOWER CHEST: Unremarkable.  ABDOMEN/PELVIS:  Liver: Diffuse fatty infiltration.  Biliary: Cholelithiasis. No gallbladder distention to suggest a skip depth obstruction.  Pancreas: Unremarkable.  Spleen: Unremarkable.  Adrenals: Unremarkable.  Kidneys and ureters: Punctate stone in the lower pole left kidney, nonobstructive. Water density mass in the lower left perirenal space, 22 mm in diameter. Although difficult to establish a visible connection to the kidney, this is most likely a cortical cyst.  Bladder: Unremarkable.  Reproductive: Unremarkable.  Bowel: Appendix upper limits of normal in size at 7-8 mm outer wall diameter. There is mild haziness of the mesoappendix. No fluid collection or necrosis.  Retroperitoneum: No mass or adenopathy.  Peritoneum: No free fluid or gas.  Vascular: No acute abnormality.  OSSEOUS: No acute abnormalities.  These results were called by telephone at the time of interpretation on 04/06/2013 at 2:34 AM to Dr. Sunnie NielsenBRIAN Maquita Sandoval , who verbally acknowledged these results.  IMPRESSION: 1. In the appropriate clinical setting, finding are consistent with early appendicitis. 2. Fatty liver. 3. Cholelithiasis. 4. Nonobstructive left nephrolithiasis.   Electronically Signed   By: Tiburcio PeaJonathan  Watts M.D.   On: 04/06/2013 02:36    IV fluids. IV Zofran. IV fentanyl.  2:45 AM discussed with general surgery, Dr. Magnus IvanBlackman - will evaluate for admission MDM   Diagnosis: Appendicitis  Evaluated with labs reviewed as above has mild leukocytosis. CT reviewed with radiologist concerning for early appendicitis. Patient also noted to have gallstones. Negative Murphy sign on exam.  Pain control IV antibiotics. IV fluids provided. N.p.o. Plan admit general surgery   Sunnie NielsenBrian Elisse Pennick, MD 04/06/13 [phone]

## 2013-04-06 ENCOUNTER — Encounter (HOSPITAL_COMMUNITY): Payer: Medicaid Other | Admitting: Anesthesiology

## 2013-04-06 ENCOUNTER — Observation Stay (HOSPITAL_COMMUNITY): Payer: Medicaid Other | Admitting: Anesthesiology

## 2013-04-06 ENCOUNTER — Encounter (HOSPITAL_COMMUNITY): Payer: Self-pay | Admitting: Radiology

## 2013-04-06 ENCOUNTER — Emergency Department (HOSPITAL_COMMUNITY): Payer: Medicaid Other

## 2013-04-06 ENCOUNTER — Encounter (HOSPITAL_COMMUNITY)
Admission: EM | Disposition: A | Discharge status: Home / Self Care | Payer: Self-pay | Source: Home / Self Care | Attending: Emergency Medicine

## 2013-04-06 DIAGNOSIS — K37 Unspecified appendicitis: Secondary | ICD-10-CM | POA: Diagnosis present

## 2013-04-06 DIAGNOSIS — K3580 Unspecified acute appendicitis: Secondary | ICD-10-CM

## 2013-04-06 HISTORY — PX: LAPAROSCOPIC APPENDECTOMY: SHX408

## 2013-04-06 LAB — SURGICAL PCR SCREEN
MRSA, PCR: POSITIVE — AB
Staphylococcus aureus: POSITIVE — AB

## 2013-04-06 SURGERY — APPENDECTOMY, LAPAROSCOPIC
Anesthesia: General

## 2013-04-06 MED ORDER — POTASSIUM CHLORIDE IN NACL 20-0.9 MEQ/L-% IV SOLN
INTRAVENOUS | Status: DC
  Administered 2013-04-06 (×2): via INTRAVENOUS
  Filled 2013-04-06 (×6): qty 1000

## 2013-04-06 MED ORDER — PROMETHAZINE HCL 25 MG/ML IJ SOLN: 6.2500 mg | INTRAMUSCULAR | Status: DC | PRN

## 2013-04-06 MED ORDER — PROPOFOL 10 MG/ML IV BOLUS
INTRAVENOUS | Status: DC | PRN
Start: 2013-04-06 — End: 2013-04-06
  Administered 2013-04-06: 200 mg via INTRAVENOUS

## 2013-04-06 MED ORDER — PIPERACILLIN-TAZOBACTAM 3.375 G IVPB 30 MIN
3.3750 g | Freq: Once | INTRAVENOUS | Status: DC
  Filled 2013-04-06: qty 50

## 2013-04-06 MED ORDER — OXYCODONE-ACETAMINOPHEN 5-325 MG PO TABS
1.0000 | ORAL_TABLET | ORAL | Status: DC | PRN
  Administered 2013-04-06: 2 via ORAL
  Administered 2013-04-07: 1 via ORAL
  Filled 2013-04-06: qty 2
  Filled 2013-04-06: qty 1

## 2013-04-06 MED ORDER — GLYCOPYRROLATE 0.2 MG/ML IJ SOLN
INTRAMUSCULAR | Status: DC | PRN
  Administered 2013-04-06: 1 mg via INTRAVENOUS

## 2013-04-06 MED ORDER — INFLUENZA VAC SPLIT QUAD 0.5 ML IM SUSP
0.5000 mL | INTRAMUSCULAR | Status: AC
  Administered 2013-04-07: 0.5 mL via INTRAMUSCULAR
  Filled 2013-04-06: qty 0.5

## 2013-04-06 MED ORDER — IOHEXOL 300 MG/ML  SOLN
100.0000 mL | Freq: Once | INTRAMUSCULAR | Status: AC | PRN
Start: 2013-04-06 — End: 2013-04-06
  Administered 2013-04-06: 100 mL via INTRAVENOUS

## 2013-04-06 MED ORDER — LACTATED RINGERS IV SOLN
INTRAVENOUS | Status: DC | PRN
  Administered 2013-04-06 (×2): via INTRAVENOUS

## 2013-04-06 MED ORDER — PHENYLEPHRINE HCL 10 MG/ML IJ SOLN
INTRAMUSCULAR | Status: DC | PRN
  Administered 2013-04-06: 120 ug via INTRAVENOUS

## 2013-04-06 MED ORDER — OXYCODONE HCL 5 MG PO TABS: 5.0000 mg | ORAL_TABLET | Freq: Once | ORAL | Status: DC | PRN

## 2013-04-06 MED ORDER — MIDAZOLAM HCL 5 MG/5ML IJ SOLN
INTRAMUSCULAR | Status: DC | PRN
  Administered 2013-04-06: 2 mg via INTRAVENOUS

## 2013-04-06 MED ORDER — ENOXAPARIN SODIUM 80 MG/0.8ML ~~LOC~~ SOLN
75.0000 mg | SUBCUTANEOUS | Status: DC
  Administered 2013-04-07: 75 mg via SUBCUTANEOUS
  Filled 2013-04-06: qty 0.8

## 2013-04-06 MED ORDER — OXYCODONE HCL 5 MG PO TABS: 5.0000 mg | ORAL_TABLET | ORAL | Status: DC | PRN

## 2013-04-06 MED ORDER — BETHANECHOL CHLORIDE 25 MG PO TABS
25.0000 mg | ORAL_TABLET | Freq: Three times a day (TID) | ORAL | Status: DC
  Administered 2013-04-06 – 2013-04-07 (×2): 25 mg via ORAL
  Filled 2013-04-06 (×4): qty 1

## 2013-04-06 MED ORDER — ONDANSETRON HCL 4 MG/2ML IJ SOLN
INTRAMUSCULAR | Status: DC | PRN
  Administered 2013-04-06: 4 mg via INTRAVENOUS

## 2013-04-06 MED ORDER — MUPIROCIN 2 % EX OINT
1.0000 "application " | TOPICAL_OINTMENT | Freq: Two times a day (BID) | CUTANEOUS | Status: DC
  Administered 2013-04-06 – 2013-04-07 (×3): 1 via NASAL
  Filled 2013-04-06: qty 22

## 2013-04-06 MED ORDER — LACTATED RINGERS IV SOLN
INTRAVENOUS | Status: DC
  Administered 2013-04-07: 07:00:00 via INTRAVENOUS

## 2013-04-06 MED ORDER — BUPIVACAINE-EPINEPHRINE 0.5% -1:200000 IJ SOLN
INTRAMUSCULAR | Status: DC | PRN
  Administered 2013-04-06: 5 mL

## 2013-04-06 MED ORDER — ENOXAPARIN SODIUM 80 MG/0.8ML ~~LOC~~ SOLN
75.0000 mg | SUBCUTANEOUS | Status: DC
  Filled 2013-04-06: qty 0.8

## 2013-04-06 MED ORDER — OXYCODONE HCL 5 MG/5ML PO SOLN: 5.0000 mg | Freq: Once | ORAL | Status: DC | PRN

## 2013-04-06 MED ORDER — SUCCINYLCHOLINE CHLORIDE 20 MG/ML IJ SOLN
INTRAMUSCULAR | Status: DC | PRN
  Administered 2013-04-06: 70 mg via INTRAVENOUS

## 2013-04-06 MED ORDER — HYDROMORPHONE HCL PF 1 MG/ML IJ SOLN: 0.2500 mg | INTRAMUSCULAR | Status: DC | PRN

## 2013-04-06 MED ORDER — BUPIVACAINE-EPINEPHRINE (PF) 0.5% -1:200000 IJ SOLN
INTRAMUSCULAR | Status: AC
  Filled 2013-04-06: qty 10

## 2013-04-06 MED ORDER — IOHEXOL 300 MG/ML  SOLN: 25.0000 mL | Freq: Once | INTRAMUSCULAR | Status: DC | PRN

## 2013-04-06 MED ORDER — NEOSTIGMINE METHYLSULFATE 1 MG/ML IJ SOLN
INTRAMUSCULAR | Status: DC | PRN
  Administered 2013-04-06: 5 mg via INTRAVENOUS

## 2013-04-06 MED ORDER — SODIUM CHLORIDE 0.9 % IR SOLN
Status: DC | PRN
  Administered 2013-04-06: 1000 mL

## 2013-04-06 MED ORDER — ROCURONIUM BROMIDE 100 MG/10ML IV SOLN
INTRAVENOUS | Status: DC | PRN
  Administered 2013-04-06: 50 mg via INTRAVENOUS

## 2013-04-06 MED ORDER — 0.9 % SODIUM CHLORIDE (POUR BTL) OPTIME
TOPICAL | Status: DC | PRN
  Administered 2013-04-06: 1000 mL

## 2013-04-06 MED ORDER — FENTANYL CITRATE 0.05 MG/ML IJ SOLN
50.0000 ug | INTRAMUSCULAR | Status: DC | PRN
  Administered 2013-04-06 (×3): 100 ug via INTRAVENOUS
  Administered 2013-04-06: 50 ug via INTRAVENOUS

## 2013-04-06 MED ORDER — CHLORHEXIDINE GLUCONATE CLOTH 2 % EX PADS: 6.0000 | MEDICATED_PAD | Freq: Every day | CUTANEOUS | Status: DC

## 2013-04-06 MED ORDER — ONDANSETRON HCL 4 MG/2ML IJ SOLN
4.0000 mg | Freq: Four times a day (QID) | INTRAMUSCULAR | Status: DC | PRN
  Administered 2013-04-06 – 2013-04-07 (×3): 4 mg via INTRAVENOUS
  Filled 2013-04-06 (×3): qty 2

## 2013-04-06 MED ORDER — EPHEDRINE SULFATE 50 MG/ML IJ SOLN
INTRAMUSCULAR | Status: DC | PRN
  Administered 2013-04-06: 10 mg via INTRAVENOUS

## 2013-04-06 MED ORDER — LIDOCAINE HCL (PF) 2 % IJ SOLN
INTRAMUSCULAR | Status: DC | PRN
  Administered 2013-04-06: 100 mg via INTRADERMAL

## 2013-04-06 MED ORDER — PIPERACILLIN-TAZOBACTAM 3.375 G IVPB
3.3750 g | Freq: Three times a day (TID) | INTRAVENOUS | Status: DC
  Administered 2013-04-06 – 2013-04-07 (×3): 3.375 g via INTRAVENOUS
  Filled 2013-04-06 (×6): qty 50

## 2013-04-06 SURGICAL SUPPLY — 51 items
APPLIER CLIP ROT 10 11.4 M/L (STAPLE)
BLADE SURG ROTATE 9660 (MISCELLANEOUS) IMPLANT
CANISTER SUCTION 2500CC (MISCELLANEOUS) ×3 IMPLANT
CHLORAPREP W/TINT 26ML (MISCELLANEOUS) ×3 IMPLANT
CLIP APPLIE ROT 10 11.4 M/L (STAPLE) IMPLANT
COVER SURGICAL LIGHT HANDLE (MISCELLANEOUS) ×3 IMPLANT
CUTTER FLEX LINEAR 45M (STAPLE) ×3 IMPLANT
DERMABOND ADHESIVE PROPEN (GAUZE/BANDAGES/DRESSINGS) ×2
DERMABOND ADVANCED (GAUZE/BANDAGES/DRESSINGS) ×2
DERMABOND ADVANCED .7 DNX12 (GAUZE/BANDAGES/DRESSINGS) ×1 IMPLANT
DERMABOND ADVANCED .7 DNX6 (GAUZE/BANDAGES/DRESSINGS) ×1 IMPLANT
DRAPE UTILITY 15X26 W/TAPE STR (DRAPE) ×6 IMPLANT
DRAPE WARM FLUID 44X44 (DRAPE) ×3 IMPLANT
ELECT REM PT RETURN 9FT ADLT (ELECTROSURGICAL) ×3
ELECTRODE REM PT RTRN 9FT ADLT (ELECTROSURGICAL) ×1 IMPLANT
ENDOLOOP SUT PDS II  0 18 (SUTURE)
ENDOLOOP SUT PDS II 0 18 (SUTURE) IMPLANT
GLOVE BIO SURGEON STRL SZ7.5 (GLOVE) ×3 IMPLANT
GLOVE BIO SURGEON STRL SZ8 (GLOVE) ×3 IMPLANT
GLOVE BIOGEL PI IND STRL 6.5 (GLOVE) ×1 IMPLANT
GLOVE BIOGEL PI IND STRL 7.0 (GLOVE) ×1 IMPLANT
GLOVE BIOGEL PI IND STRL 7.5 (GLOVE) ×2 IMPLANT
GLOVE BIOGEL PI IND STRL 8 (GLOVE) ×1 IMPLANT
GLOVE BIOGEL PI INDICATOR 6.5 (GLOVE) ×2
GLOVE BIOGEL PI INDICATOR 7.0 (GLOVE) ×2
GLOVE BIOGEL PI INDICATOR 7.5 (GLOVE) ×4
GLOVE BIOGEL PI INDICATOR 8 (GLOVE) ×2
GLOVE SURG SS PI 6.5 STRL IVOR (GLOVE) ×3 IMPLANT
GLOVE SURG SS PI 7.5 STRL IVOR (GLOVE) ×3 IMPLANT
GOWN STRL NON-REIN LRG LVL3 (GOWN DISPOSABLE) ×9 IMPLANT
GOWN STRL REIN XL XLG (GOWN DISPOSABLE) ×3 IMPLANT
KIT BASIN OR (CUSTOM PROCEDURE TRAY) ×3 IMPLANT
KIT ROOM TURNOVER OR (KITS) ×3 IMPLANT
NS IRRIG 1000ML POUR BTL (IV SOLUTION) ×3 IMPLANT
PAD ARMBOARD 7.5X6 YLW CONV (MISCELLANEOUS) ×6 IMPLANT
POUCH SPECIMEN RETRIEVAL 10MM (ENDOMECHANICALS) ×3 IMPLANT
RELOAD STAPLE TA45 3.5 REG BLU (ENDOMECHANICALS) ×3 IMPLANT
SCALPEL HARMONIC ACE (MISCELLANEOUS) ×3 IMPLANT
SET IRRIG TUBING LAPAROSCOPIC (IRRIGATION / IRRIGATOR) ×3 IMPLANT
SLEEVE ENDOPATH XCEL 5M (ENDOMECHANICALS) ×3 IMPLANT
SPECIMEN JAR SMALL (MISCELLANEOUS) ×3 IMPLANT
SUT MON AB 4-0 PC3 18 (SUTURE) ×3 IMPLANT
TOWEL OR 17X24 6PK STRL BLUE (TOWEL DISPOSABLE) IMPLANT
TOWEL OR 17X26 10 PK STRL BLUE (TOWEL DISPOSABLE) ×3 IMPLANT
TRAY FOLEY CATH 14FR (SET/KITS/TRAYS/PACK) ×3 IMPLANT
TRAY FOLEY CATH 16FR SILVER (SET/KITS/TRAYS/PACK) IMPLANT
TRAY LAPAROSCOPIC (CUSTOM PROCEDURE TRAY) ×3 IMPLANT
TROCAR XCEL BLADELESS 5X75MML (TROCAR) ×6 IMPLANT
TROCAR XCEL BLUNT TIP 100MML (ENDOMECHANICALS) ×3 IMPLANT
TROCAR XCEL NON-BLD 5MMX100MML (ENDOMECHANICALS) ×3 IMPLANT
WATER STERILE IRR 1000ML POUR (IV SOLUTION) IMPLANT

## 2013-04-06 NOTE — Op Note (Signed)
Appendectomy, Lap, Procedure Note  Indications: The patient presented with a history of right-sided abdominal pain. A CT and exam  revealed findings consistent with acute appendicitis.Discussed medical and surgical options.  Risk and benefits of each.  Pt wished to proceed with laparoscopic appendectomy.  The procedure has been discussed with the patient.  Alternative therapies have been discussed with the patient.  Operative risks include bleeding,  Infection,  Organ injury, abscess,  Bowel injury ,  Nerve injury,  Blood vessel injury,  DVT,  Pulmonary embolism,  Death,  And possible reoperation.  Medical management risks include worsening of present situation.  The success of the procedure is 50 -100  % at treating patients symptoms.  The patient understands and agrees to proceed.  Pre-operative Diagnosis: Acute appendicitis without mention of peritonitis  Post-operative Diagnosis: Same  Surgeon: Samin Milke A.   Assistants: OR staff  Anesthesia: General endotracheal anesthesia and Local anesthesia 0.25.% bupivacaine, with epinephrine  ASA Class: 2  Procedure Details  The patient was seen again in the Holding Room. The risks, benefits, complications, treatment options, and expected outcomes were discussed with the patient and/or family. The possibilities of reaction to medication, pulmonary aspiration, perforation of viscus, bleeding, recurrent infection, finding a normal appendix, the need for additional procedures, failure to diagnose a condition, and creating a complication requiring transfusion or operation were discussed. There was concurrence with the proposed plan and informed consent was obtained. The site of surgery was properly noted/marked. The patient was taken to Operating Room, identified as [name] and the procedure verified as Appendectomy. A Time Out was held and the above information confirmed.  The patient was placed in the supine position and general anesthesia was  induced, along with placement of orogastric tube, Venodyne boots, and a Foley catheter. The abdomen was prepped and draped in a sterile fashion. A one centimeter infraumbilical incision was made and the peritoneal cavity was accessed using the OPEN  technique. The pneumoperitoneum was then established to steady pressure of 12 mmHg. A 12 mm port was placed through the umbilical incision. Additional 5 mm cannulas then placed in the right upper  quadrant of the abdomen and half way between the umbilicus and pubic symphysis under direct vision. A careful evaluation of the entire abdomen was carried out. The patient was placed in Trendelenburg and left lateral decubitus position. The small intestines were retracted in the cephalad and left lateral direction away from the pelvis and right lower quadrant. The patient was found to have an enlarged and inflamed appendix that was extending into the pelvis. There was no evidence of perforation.  The appendix was carefully dissected. A window was made in the mesoappendix at the base of the appendix. A harmonic scalpel was used across the mesoappendix. The appendix was divided at its base using an endo-GIA stapler. Minimal appendiceal stump was left in place. There was no evidence of bleeding, leakage, or complication after division of the appendix. Irrigation was also performed and irrigate suctioned from the abdomen as well. Staple line intact and stump intact.  No evidence of injury to colon or small bowel upon careful examination.. Right ovary normal.  Uterus normal.  Left ovary could not be seen.  Rectum,  Sigmoid,  Descending,  Transverse and ascending colon normal.  Small bowel normal.   The umbilical port site was closed using 0 vicryl pursestring sutures fashion at the level of the fascia. The trocar site skin wounds were closed using 4 0 monocryl..  Instrument, sponge,  and needle counts were correct at the conclusion of the case.   Findings: The appendix was  found to be inflamed. There were not signs of necrosis.  There was not perforation. There was not abscess formation.  Estimated Blood Loss:  Minimal         Drains: none         Total IV Fluids: 400 mL         Specimens: appendix         Complications:  None; patient tolerated the procedure well.         Disposition: PACU - hemodynamically stable.         Condition: stable

## 2013-04-06 NOTE — Anesthesia Postprocedure Evaluation (Signed)
  Anesthesia Post-op Note  Patient: [name]  Procedure(s) Performed: Procedure(s): APPENDECTOMY LAPAROSCOPIC (N/A)  Patient Location: PACU  Anesthesia Type:General  Level of Consciousness: awake, alert  and oriented  Airway and Oxygen Therapy: Patient Spontanous Breathing  Post-op Pain: mild  Post-op Assessment: Post-op Vital signs reviewed, Patient's Cardiovascular Status Stable, Respiratory Function Stable, Patent Airway, No signs of Nausea or vomiting and Pain level controlled  Post-op Vital Signs: Reviewed and stable  Complications: No apparent anesthesia complications

## 2013-04-06 NOTE — Anesthesia Preprocedure Evaluation (Addendum)
Anesthesia Evaluation  Patient identified by MRN, date of birth, ID band Patient awake    Reviewed: Allergy & Precautions, H&P , NPO status   Airway Mallampati: II  Neck ROM: Full    Dental   Pulmonary Recent URI , Resolved,  breath sounds clear to auscultation        Cardiovascular Rhythm:Regular Rate:Normal     Neuro/Psych  Headaches, PSYCHIATRIC DISORDERS Depression    GI/Hepatic GERD-  Medicated,  Endo/Other    Renal/GU      Musculoskeletal   Abdominal (+) + obese,   Peds  Hematology   Anesthesia Other Findings   Reproductive/Obstetrics                          Anesthesia Physical Anesthesia Plan  ASA: II  Anesthesia Plan: General   Post-op Pain Management:    Induction: Intravenous  Airway Management Planned: Oral ETT  Additional Equipment:   Intra-op Plan:   Post-operative Plan: Extubation in OR  Informed Consent: I have reviewed the patients History and Physical, chart, labs and discussed the procedure including the risks, benefits and alternatives for the proposed anesthesia with the patient or authorized representative who has indicated his/her understanding and acceptance.   Dental advisory given  Plan Discussed with: CRNA and Surgeon  Anesthesia Plan Comments:        Anesthesia Quick Evaluation

## 2013-04-06 NOTE — Discharge Instructions (Signed)
[facility] ______[facility] °LAPAROSCOPIC SURGERY: POST OP INSTRUCTIONS °Always review your discharge instruction sheet given to you by the facility where your surgery was performed. °IF YOU HAVE DISABILITY OR FAMILY LEAVE FORMS, YOU MUST BRING THEM TO THE OFFICE FOR PROCESSING.   °DO NOT GIVE THEM TO YOUR DOCTOR. ° °1. A prescription for pain medication may be given to you upon discharge.  Take your pain medication as prescribed, if needed.  If narcotic pain medicine is not needed, then you may take acetaminophen (Tylenol) or ibuprofen (Advil) as needed. °2. Take your usually prescribed medications unless otherwise directed. °3. If you need a refill on your pain medication, please contact your pharmacy.  They will contact our office to request authorization. Prescriptions will not be filled after 5pm or on week-ends. °4. You should follow a light diet the first few days after arrival home, such as soup and crackers, etc.  Be sure to include lots of fluids daily. °5. Most patients will experience some swelling and bruising in the area of the incisions.  Ice packs will help.  Swelling and bruising can take several days to resolve.  °6. It is common to experience some constipation if taking pain medication after surgery.  Increasing fluid intake and taking a stool softener (such as Colace) will usually help or prevent this problem from occurring.  A mild laxative (Milk of Magnesia or Miralax) should be taken according to package instructions if there are no bowel movements after 48 hours. °7. Unless discharge instructions indicate otherwise, you may remove your bandages 24-48 hours after surgery, and you may shower at that time.  You may have steri-strips (small skin tapes) in place directly over the incision.  These strips should be left on the skin for 7-10 days.  If your surgeon used skin glue on the incision, you may shower in 24 hours.  The glue will flake off over the next 2-3 weeks.  Any sutures or  staples will be removed at the office during your follow-up visit. °8. ACTIVITIES:  You may resume regular (light) daily activities beginning the next day--such as daily self-care, walking, climbing stairs--gradually increasing activities as tolerated.  You may have sexual intercourse when it is comfortable.  Refrain from any heavy lifting or straining until approved by your doctor. °a. You may drive when you are no longer taking prescription pain medication, you can comfortably wear a seatbelt, and you can safely maneuver your car and apply brakes. °b. RETURN TO WORK:  __________________________________________________________ °9. You should see your doctor in the office for a follow-up appointment approximately 2-3 weeks after your surgery.  Make sure that you call for this appointment within a day or two after you arrive home to insure a convenient appointment time. °10. OTHER INSTRUCTIONS: __________________________________________________________________________________________________________________________ __________________________________________________________________________________________________________________________ °WHEN TO CALL YOUR DOCTOR: °1. Fever over 101.0 °2. Inability to urinate °3. Continued bleeding from incision. °4. Increased pain, redness, or drainage from the incision. °5. Increasing abdominal pain ° °The clinic staff is available to answer your questions during regular business hours.  Please don’t hesitate to call and ask to speak to one of the nurses for clinical concerns.  If you have a medical emergency, go to the nearest emergency room or call 911.  A surgeon from [facility] is always on call at the hospital. °[address], [address], [place], [place]  [address] ? [address] [address], [place], [place]   [address] °[phone] ? [phone] ? FAX [phone] °Web site:   [email or web address] °

## 2013-04-06 NOTE — ED Notes (Signed)
Pt back from CT

## 2013-04-06 NOTE — Progress Notes (Signed)
4:18 AM   lovenox for VTE ppx has been changed to 0.5mg /kg/24 hours with BMI>30 per lovenox protocol.   Janice CoffinEarl, Basia Mcginty Jonathan

## 2013-04-06 NOTE — H&P (View-Only) (Signed)
Patient ID: [name], female   DOB: 12/26/1975, 37 y.o.   MRN: [id number] Patient just admitted early this morning with appendicitis.  She still c/o pain and nausea.  She is agreeable to proceed with appendectomy today.  Will plan on OR around 9:30am this morning.  Maxi Carreras E 7:49 AM 04/06/2013  

## 2013-04-06 NOTE — H&P (View-Only) (Signed)
Pt seen and questions answered.  Lap appendectomy. The procedure has been discussed with the patient.  Alternative therapies have been discussed with the patient.  Operative risks include bleeding,  Infection,  Organ injury,  Nerve injury,  Blood vessel injury,  DVT,  Pulmonary embolism,  Death,  And possible reoperation.  Medical management risks include worsening of present situation.  The success of the procedure is 50 -90 % at treating patients symptoms.  The patient understands and agrees to proceed.

## 2013-04-06 NOTE — ED Notes (Signed)
Pt taken to CT.

## 2013-04-06 NOTE — H&P (Signed)
[name] is an 38 y.o. female.   Chief Complaint: Right lower quadrant abdominal pain HPI: This is a morbidly obese 38 year old female who presents with right lower quadrant abdominal pain. She reports that she has had nausea for the last 3 days but then started having right lower quadrant abdominal pain around 4 PM last evening. The pain is sharp, moderate, and constant. It is not refer anywhere else.  She is otherwise without complaints  Past Medical History  Diagnosis Date  . Obesity   . Recurrent upper respiratory infection (URI)     cold sx 2 wks ago   . Heartburn   . Headache(784.0)     tx w/OTC meds prn  . Depression     no meds  . 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 MRSA (methicillin resistant staph aureus) culture positive     Past Surgical History  Procedure Laterality Date  . Wisdom tooth extraction    . Svd      x 4 -3 epidurals and 1 spinal w/ SVD  . Laparoscopic tubal ligation  06/11/2011    Procedure: LAPAROSCOPIC TUBAL LIGATION;  Surgeon: Lahoma Crocker, MD;  Location: [facility];  Service: Gynecology;  Laterality: N/A;    Family History  Problem Relation Age of Onset  . Diabetes Mother   . Hyperlipidemia Mother    Social History:  reports that she quit smoking about 2 years ago. Her smoking use included Cigarettes. She has a 1 pack-year smoking history. She has never used smokeless tobacco. She reports that she does not drink alcohol or use illicit drugs.  Allergies: No Known Allergies   (Not in a hospital admission)  Results for orders placed during the hospital encounter of 04/05/13 (from the past 48 hour(s))  COMPREHENSIVE METABOLIC PANEL     Status: Abnormal   Collection Time    04/05/13  5:35 PM      Result Value Range   Sodium 140  137 - 147 mEq/L   Potassium 4.0  3.7 - 5.3 mEq/L   Chloride 101  96 - 112 mEq/L   CO2 27  19 - 32 mEq/L   Glucose, Bld 90  70 - 99 mg/dL   BUN 14  6 - 23 mg/dL   Creatinine,  Ser 0.85  0.50 - 1.10 mg/dL   Calcium 9.3  8.4 - 10.5 mg/dL   Total Protein 8.1  6.0 - 8.3 g/dL   Albumin 3.9  3.5 - 5.2 g/dL   AST 18  0 - 37 U/L   ALT 17  0 - 35 U/L   Alkaline Phosphatase 85  39 - 117 U/L   Total Bilirubin 0.5  0.3 - 1.2 mg/dL   GFR calc non Af Amer 87 (*) >90 mL/min   GFR calc Af Amer >90  >90 mL/min   Comment: (NOTE)     The eGFR has been calculated using the CKD EPI equation.     This calculation has not been validated in all clinical situations.     eGFR's persistently <90 mL/min signify possible Chronic Kidney     Disease.  CBC WITH DIFFERENTIAL     Status: Abnormal   Collection Time    04/05/13  5:35 PM      Result Value Range   WBC 13.5 (*) 4.0 - 10.5 K/uL   RBC 4.70  3.87 - 5.11 MIL/uL   Hemoglobin 13.6  12.0 - 15.0 g/dL   HCT  39.5  36.0 - 46.0 %   MCV 84.0  78.0 - 100.0 fL   MCH 28.9  26.0 - 34.0 pg   MCHC 34.4  30.0 - 36.0 g/dL   RDW 13.2  11.5 - 15.5 %   Platelets 240  150 - 400 K/uL   Neutrophils Relative % 77  43 - 77 %   Neutro Abs 10.4 (*) 1.7 - 7.7 K/uL   Lymphocytes Relative 14  12 - 46 %   Lymphs Abs 1.9  0.7 - 4.0 K/uL   Monocytes Relative 7  3 - 12 %   Monocytes Absolute 0.9  0.1 - 1.0 K/uL   Eosinophils Relative 2  0 - 5 %   Eosinophils Absolute 0.2  0.0 - 0.7 K/uL   Basophils Relative 0  0 - 1 %   Basophils Absolute 0.0  0.0 - 0.1 K/uL  URINALYSIS, ROUTINE W REFLEX MICROSCOPIC     Status: Abnormal   Collection Time    04/05/13  6:29 PM      Result Value Range   Color, Urine YELLOW  YELLOW   APPearance TURBID (*) CLEAR   Specific Gravity, Urine 1.023  1.005 - 1.030   pH 7.0  5.0 - 8.0   Glucose, UA NEGATIVE  NEGATIVE mg/dL   Hgb urine dipstick NEGATIVE  NEGATIVE   Bilirubin Urine NEGATIVE  NEGATIVE   Ketones, ur NEGATIVE  NEGATIVE mg/dL   Protein, ur NEGATIVE  NEGATIVE mg/dL   Urobilinogen, UA 0.2  0.0 - 1.0 mg/dL   Nitrite NEGATIVE  NEGATIVE   Leukocytes, UA SMALL (*) NEGATIVE  URINE MICROSCOPIC-ADD ON     Status: None    Collection Time    04/05/13  6:29 PM      Result Value Range   Squamous Epithelial / LPF RARE  RARE   WBC, UA 0-2  <3 WBC/hpf   Bacteria, UA RARE  RARE   Urine-Other AMORPHOUS URATES/PHOSPHATES    POCT PREGNANCY, URINE     Status: None   Collection Time    04/05/13  6:38 PM      Result Value Range   Preg Test, Ur NEGATIVE  NEGATIVE   Comment:            THE SENSITIVITY OF THIS     METHODOLOGY IS >24 mIU/mL   Ct Abdomen Pelvis W Contrast  04/06/2013   CLINICAL DATA:  Right lower quadrant pain  EXAM: CT ABDOMEN AND PELVIS WITH CONTRAST  TECHNIQUE: Multidetector CT imaging of the abdomen and pelvis was performed using the standard protocol following bolus administration of intravenous contrast.  CONTRAST:  130m OMNIPAQUE IOHEXOL 300 MG/ML  SOLN  COMPARISON:  None.  FINDINGS: BODY WALL: Unremarkable.  LOWER CHEST: Unremarkable.  ABDOMEN/PELVIS:  Liver: Diffuse fatty infiltration.  Biliary: Cholelithiasis. No gallbladder distention to suggest a skip depth obstruction.  Pancreas: Unremarkable.  Spleen: Unremarkable.  Adrenals: Unremarkable.  Kidneys and ureters: Punctate stone in the lower pole left kidney, nonobstructive. Water density mass in the lower left perirenal space, 22 mm in diameter. Although difficult to establish a visible connection to the kidney, this is most likely a cortical cyst.  Bladder: Unremarkable.  Reproductive: Unremarkable.  Bowel: Appendix upper limits of normal in size at 7-8 mm outer wall diameter. There is mild haziness of the mesoappendix. No fluid collection or necrosis.  Retroperitoneum: No mass or adenopathy.  Peritoneum: No free fluid or gas.  Vascular: No acute abnormality.  OSSEOUS: No acute abnormalities.  These  results were called by telephone at the time of interpretation on 04/06/2013 at 2:34 AM to Dr. Teressa Lower , who verbally acknowledged these results.  IMPRESSION: 1. In the appropriate clinical setting, finding are consistent with early appendicitis. 2. Fatty  liver. 3. Cholelithiasis. 4. Nonobstructive left nephrolithiasis.   Electronically Signed   By: Jorje Guild M.D.   On: 04/06/2013 02:36    Review of Systems  All other systems reviewed and are negative.    Blood pressure 114/76, pulse 80, temperature 97.8 F (36.6 C), temperature source Oral, resp. rate 18, height '5\' 7"'  (1.702 m), weight 320 lb (145.151 kg), SpO2 100.00%. Physical Exam  Constitutional: She is oriented to person, place, and time. No distress.  Morbidly obese  HENT:  Head: Normocephalic and atraumatic.  Right Ear: External ear normal.  Left Ear: External ear normal.  Nose: Nose normal.  Mouth/Throat: Oropharynx is clear and moist. No oropharyngeal exudate.  Eyes: Conjunctivae are normal. Pupils are equal, round, and reactive to light. Right eye exhibits no discharge. Left eye exhibits no discharge. No scleral icterus.  Neck: Normal range of motion. Neck supple. No tracheal deviation present.  Cardiovascular: Normal rate, regular rhythm, normal heart sounds and intact distal pulses.   No murmur heard. Respiratory: Effort normal and breath sounds normal. No respiratory distress. She has no wheezes. She has no rales.  GI: Soft. There is tenderness.  Obese abdomen  Very mild tenderness and guarding in the right lower quadrant. The rest of the abdomen is nontender  Lymphadenopathy:    She has no cervical adenopathy.  Neurological: She is alert and oriented to person, place, and time.  Skin: Skin is warm and dry. No rash noted. She is not diaphoretic. No erythema.  Psychiatric: Her behavior is normal. Judgment normal.     Assessment/Plan Acute appendicitis  Plan will be to admit the patient to the hospital and start her on IV antibiotics. I discussed conservative management with IV antibiotics for surgery. She believes she will proceed with surgery which would be a laparoscopic appendectomy.  I discussed the surgery with her in detail. I discussed the risks which  includes but is not limited to bleeding, infection, injury to surrounding structures, appendiceal stump leak, finding a normal appendix, cardiopulmonary issues and DVT given her morbid obesity, et Ronney Asters. She'll be admitted to the hospital, left at bowel rest, and surgery will be planned for later today  Sterlin Knightly A 04/06/2013, 3:06 AM

## 2013-04-06 NOTE — Interval H&P Note (Signed)
History and Physical Interval Note:  04/06/2013 8:55 AM  Sandra CrossYvonne D Virgen  has presented today for surgery, with the diagnosis of appendicitis  The various methods of treatment have been discussed with the patient and family. After consideration of risks, benefits and other options for treatment, the patient has consented to  Procedure(s): APPENDECTOMY LAPAROSCOPIC (N/A) as a surgical intervention .  The patient's history has been reviewed, patient examined, no change in status, stable for surgery.  I have reviewed the patient's chart and labs.  Questions were answered to the patient's satisfaction.     Lillard Bailon A.

## 2013-04-06 NOTE — Progress Notes (Signed)
Pt seen and questions answered.  Lap appendectomy. The procedure has been discussed with the patient.  Alternative therapies have been discussed with the patient.  Operative risks include bleeding,  Infection,  Organ injury,  Nerve injury,  Blood vessel injury,  DVT,  Pulmonary embolism,  Death,  And possible reoperation.  Medical management risks include worsening of present situation.  The success of the procedure is 50 -90 % at treating patients symptoms.  The patient understands and agrees to proceed. 

## 2013-04-06 NOTE — Progress Notes (Signed)
UR completed 

## 2013-04-06 NOTE — Anesthesia Procedure Notes (Signed)
Procedure Name: Intubation Date/Time: 04/06/2013 10:36 AM Performed by: Whitman HeroVITSHTEYN, Jamichael Knotts Pre-anesthesia Checklist: Emergency Drugs available, Patient identified, Suction available and Patient being monitored Patient Re-evaluated:Patient Re-evaluated prior to inductionOxygen Delivery Method: Circle system utilized Preoxygenation: Pre-oxygenation with 100% oxygen Intubation Type: IV induction, Rapid sequence and Cricoid Pressure applied Ventilation: Mask ventilation without difficulty Laryngoscope Size: Mac and 3 Grade View: Grade I Tube type: Oral Tube size: 7.0 mm Number of attempts: 1 Airway Equipment and Method: Patient positioned with wedge pillow and Stylet Secured at: 20 cm Tube secured with: Tape Dental Injury: Teeth and Oropharynx as per pre-operative assessment  Difficulty Due To: Difficulty was anticipated and Difficult Airway-  due to edematous airway Future Recommendations: Recommend- induction with short-acting agent, and alternative techniques readily available

## 2013-04-06 NOTE — Progress Notes (Signed)
Patient ID: [name], female   DOB: 01/26/1976, 38 y.o.   MRN: [id number] Patient just admitted early this morning with appendicitis.  She still c/o pain and nausea.  She is agreeable to proceed with appendectomy today.  Will plan on OR around 9:30am this morning.  Brylie Sneath E 7:49 AM 04/06/2013

## 2013-04-06 NOTE — Progress Notes (Signed)
38yo female c/o RLQ abdominal pain assocuated w/ nausea, CT c/w early appendicitis, to begin IV ABX.  Will start Zosyn 3.375g IV Q8H for CrCl >100 ml/min and monitor clinical progression.  Vernard GamblesVeronda Kyllian Clingerman, PharmD, BCPS 04/06/2013 4:16 AM

## 2013-04-06 NOTE — Transfer of Care (Signed)
Immediate Anesthesia Transfer of Care Note  Patient: [name]  Procedure(s) Performed: Procedure(s): APPENDECTOMY LAPAROSCOPIC (N/A)  Patient Location: PACU  Anesthesia Type:General  Level of Consciousness: awake, alert , oriented and patient cooperative  Airway & Oxygen Therapy: Patient Spontanous Breathing and Patient connected to face mask oxygen  Post-op Assessment: Report given to PACU RN and Post -op Vital signs reviewed and stable  Post vital signs: Reviewed and stable  Complications: No apparent anesthesia complications

## 2013-04-07 MED ORDER — OXYCODONE-ACETAMINOPHEN 5-325 MG PO TABS: 1.0000 | ORAL_TABLET | ORAL | Status: DC | PRN

## 2013-04-07 NOTE — Progress Notes (Signed)
Patient discharge home. Discharge instructions given. Prescription given. No issues.

## 2013-04-07 NOTE — Progress Notes (Signed)
Patient had issue with urinary retention at beginning of shift.  MD notified and orders received.  In and out cathed at 2015 with 1000cc clear, yellow urine returned.  Pt tolerated procedure well and reported much relief.  Now voiding without difficulty and emptying bladder completely.    Nausea controlled with zofran given at midnight and pain controlled well with percocet given 2000.     BP low during the night 93/34 and 81/42.  IV bolus given and BP up to 92/50.  Pt denies dizziness.  Continue to monitor.

## 2013-04-07 NOTE — Progress Notes (Signed)
1 Day Post-Op  Subjective: She is feeling better. Tolerating diet  Objective: Vital signs in last 24 hours: Temp:  [97.7 F (36.5 C)-98.3 F (36.8 C)] 97.7 F (36.5 C) (01/17 0530) Pulse Rate:  [69-97] 71 (01/17 0530) Resp:  [14-26] 18 (01/17 0530) BP: (81-129)/(3-63) 92/50 mmHg (01/17 0630) SpO2:  [91 %-100 %] 92 % (01/17 0530) Last BM Date: 04/04/13  Intake/Output from previous day: 01/16 0701 - 01/17 0700 In: 2934 [P.O.:125; I.V.:2709; IV Piggyback:100] Out: 1350 [Urine:1350] Intake/Output this shift: Total I/O In: 360 [P.O.:360] Out: -   Resp: clear to auscultation bilaterally Cardio: regular rate and rhythm GI: soft, mild tenderness  Lab Results:   Recent Labs  04/05/13 1735  WBC 13.5*  HGB 13.6  HCT 39.5  PLT 240   BMET  Recent Labs  04/05/13 1735  NA 140  K 4.0  CL 101  CO2 27  GLUCOSE 90  BUN 14  CREATININE 0.85  CALCIUM 9.3   PT/INR No results found for this basename: LABPROT, INR,  in the last 72 hours ABG No results found for this basename: PHART, PCO2, PO2, HCO3,  in the last 72 hours  Studies/Results: Ct Abdomen Pelvis W Contrast  04/06/2013   CLINICAL DATA:  Right lower quadrant pain  EXAM: CT ABDOMEN AND PELVIS WITH CONTRAST  TECHNIQUE: Multidetector CT imaging of the abdomen and pelvis was performed using the standard protocol following bolus administration of intravenous contrast.  CONTRAST:  100mL OMNIPAQUE IOHEXOL 300 MG/ML  SOLN  COMPARISON:  None.  FINDINGS: BODY WALL: Unremarkable.  LOWER CHEST: Unremarkable.  ABDOMEN/PELVIS:  Liver: Diffuse fatty infiltration.  Biliary: Cholelithiasis. No gallbladder distention to suggest a skip depth obstruction.  Pancreas: Unremarkable.  Spleen: Unremarkable.  Adrenals: Unremarkable.  Kidneys and ureters: Punctate stone in the lower pole left kidney, nonobstructive. Water density mass in the lower left perirenal space, 22 mm in diameter. Although difficult to establish a visible connection to the  kidney, this is most likely a cortical cyst.  Bladder: Unremarkable.  Reproductive: Unremarkable.  Bowel: Appendix upper limits of normal in size at 7-8 mm outer wall diameter. There is mild haziness of the mesoappendix. No fluid collection or necrosis.  Retroperitoneum: No mass or adenopathy.  Peritoneum: No free fluid or gas.  Vascular: No acute abnormality.  OSSEOUS: No acute abnormalities.  These results were called by telephone at the time of interpretation on 04/06/2013 at 2:34 AM to Dr. Sunnie NielsenBRIAN OPITZ , who verbally acknowledged these results.  IMPRESSION: 1. In the appropriate clinical setting, finding are consistent with early appendicitis. 2. Fatty liver. 3. Cholelithiasis. 4. Nonobstructive left nephrolithiasis.   Electronically Signed   By: Tiburcio PeaJonathan  Watts M.D.   On: 04/06/2013 02:36    Anti-infectives: Anti-infectives   Start     Dose/Rate Route Frequency Ordered Stop   04/06/13 1200  piperacillin-tazobactam (ZOSYN) IVPB 3.375 g  Status:  Discontinued     3.375 g 100 mL/hr over 30 Minutes Intravenous  Once 04/06/13 0415 04/07/13 1100   04/06/13 0430  piperacillin-tazobactam (ZOSYN) IVPB 3.375 g     3.375 g 12.5 mL/hr over 240 Minutes Intravenous Every 8 hours 04/06/13 0415        Assessment/Plan: s/p Procedure(s): APPENDECTOMY LAPAROSCOPIC (N/A) Advance diet Discharge  LOS: 2 days    TOTH III,Rowyn Mustapha S 04/07/2013

## 2013-04-07 NOTE — Discharge Summary (Signed)
Physician Discharge Summary  Patient ID: [name] MRN: [id number] DOB/AGE: 37/09/1975 37 y.o.  Admit date: 04/05/2013 Discharge date: 04/07/2013  Admission Diagnoses:  Discharge Diagnoses:  Active Problems:   Appendicitis   Discharged Condition: good  Hospital Course: the pt underwent lap appy. She tolerated the surgery well. On pod 1 she was ready for discharge home  Consults: None  Significant Diagnostic Studies: none  Treatments: surgery: as above  Discharge Exam: Blood pressure 92/50, pulse 71, temperature 97.7 F (36.5 C), temperature source Oral, resp. rate 18, height 5\' 8"  (1.727 m), weight 322 lb (146.058 kg), last menstrual period 06/04/2012, SpO2 92.00%, not currently breastfeeding. GI: soft, mild tenderness  Disposition: 01-Home or Self Care  Discharge Orders   Future Appointments Provider Department Dept Phone   05/01/2013 3:00 PM [facility] [place] [phone]   Future Orders Complete By Expires   Call MD for:  difficulty breathing, headache or visual disturbances  As directed    Call MD for:  extreme fatigue  As directed    Call MD for:  hives  As directed    Call MD for:  persistant dizziness or light-headedness  As directed    Call MD for:  persistant nausea and vomiting  As directed    Call MD for:  redness, tenderness, or signs of infection (pain, swelling, redness, odor or green/yellow discharge around incision site)  As directed    Call MD for:  severe uncontrolled pain  As directed    Call MD for:  temperature >100.4  As directed    Diet - low sodium heart healthy  As directed    Discharge instructions  As directed    Comments:     May shower. No heavy lifting. Diet as tolerated   Increase activity slowly  As directed    No wound care  As directed        Medication List         acetaminophen 500 MG tablet  Commonly known as:  TYLENOL  Take 1,000 mg by mouth every 6 (six) hours as needed for moderate  pain.     aspirin 325 MG tablet  Take 325 mg by mouth every 6 (six) hours as needed for moderate pain.     oxyCODONE-acetaminophen 5-325 MG per tablet  Commonly known as:  ROXICET  Take 1-2 tablets by mouth every 4 (four) hours as needed for severe pain.           Follow-up Information   Follow up with [facility] On 05/01/2013. (3:00pm, arrive at 2:30pm for paperwork)    Contact information:   [address]   [place] [address] [phone]       Follow up with CORNETT,THOMAS A., MD In 2 weeks.   Specialty:  General Surgery   Contact information:   [address] [place] [address] [phone]       Signed: Robyne AskewOTH III,Sheryl Saintil S 04/07/2013, 11:38 AM

## 2013-04-10 ENCOUNTER — Encounter (HOSPITAL_COMMUNITY): Payer: Self-pay | Admitting: Surgery

## 2013-04-16 ENCOUNTER — Telehealth (INDEPENDENT_AMBULATORY_CARE_PROVIDER_SITE_OTHER): Payer: Self-pay | Admitting: General Surgery

## 2013-04-16 NOTE — Telephone Encounter (Signed)
[facility] pt had lap appy and is now home; postop OV on 05/01/13.  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  She will not be able to restrict her lifting if she returns, as she has pts assigned to her that she sees independently.  Some require OOB by Michiel SitesHoyer, etc.  Discussed how exceeding her limitations could potently be harmful and extend her normal recovery expectations.  She will wait until her evaluation at the [facility].

## 2013-04-17 ENCOUNTER — Telehealth (INDEPENDENT_AMBULATORY_CARE_PROVIDER_SITE_OTHER): Payer: Self-pay

## 2013-04-17 NOTE — Telephone Encounter (Signed)
Patient states she mis having dizziness and head aches which started yesterday, she states she is not taking pain medications at this time,and she is not on cardiac meds of any kind. Advised her to call her PCP for elevation, and increase her fluids. I will inform DR. Cornett

## 2013-04-27 ENCOUNTER — Emergency Department (HOSPITAL_COMMUNITY)
Admission: EM | Admit: 2013-04-27 | Discharge: 2013-04-28 | Disposition: A | Discharge status: Home / Self Care | Payer: Medicaid Other | Attending: Emergency Medicine | Admitting: Emergency Medicine

## 2013-04-27 ENCOUNTER — Encounter (HOSPITAL_COMMUNITY): Payer: Self-pay | Admitting: Emergency Medicine

## 2013-04-27 DIAGNOSIS — Z8659 Personal history of other mental and behavioral disorders: Secondary | ICD-10-CM | POA: Insufficient documentation

## 2013-04-27 DIAGNOSIS — L0390 Cellulitis, unspecified: Secondary | ICD-10-CM

## 2013-04-27 DIAGNOSIS — E669 Obesity, unspecified: Secondary | ICD-10-CM | POA: Insufficient documentation

## 2013-04-27 DIAGNOSIS — Y836 Removal of other organ (partial) (total) as the cause of abnormal reaction of the patient, or of later complication, without mention of misadventure at the time of the procedure: Secondary | ICD-10-CM | POA: Insufficient documentation

## 2013-04-27 DIAGNOSIS — T8140XA Infection following a procedure, unspecified, initial encounter: Secondary | ICD-10-CM | POA: Insufficient documentation

## 2013-04-27 DIAGNOSIS — R6883 Chills (without fever): Secondary | ICD-10-CM | POA: Insufficient documentation

## 2013-04-27 DIAGNOSIS — T8149XA Infection following a procedure, other surgical site, initial encounter: Secondary | ICD-10-CM

## 2013-04-27 DIAGNOSIS — F17200 Nicotine dependence, unspecified, uncomplicated: Secondary | ICD-10-CM | POA: Insufficient documentation

## 2013-04-27 DIAGNOSIS — Z8614 Personal history of Methicillin resistant Staphylococcus aureus infection: Secondary | ICD-10-CM | POA: Insufficient documentation

## 2013-04-27 DIAGNOSIS — Z8719 Personal history of other diseases of the digestive system: Secondary | ICD-10-CM | POA: Insufficient documentation

## 2013-04-27 LAB — COMPREHENSIVE METABOLIC PANEL
ALT: 13 U/L (ref 0–35)
AST: 13 U/L (ref 0–37)
Albumin: 3.4 g/dL — ABNORMAL LOW (ref 3.5–5.2)
Alkaline Phosphatase: 85 U/L (ref 39–117)
BUN: 13 mg/dL (ref 6–23)
CALCIUM: 8.9 mg/dL (ref 8.4–10.5)
CO2: 27 mEq/L (ref 19–32)
Chloride: 101 mEq/L (ref 96–112)
Creatinine, Ser: 0.84 mg/dL (ref 0.50–1.10)
GFR calc non Af Amer: 88 mL/min — ABNORMAL LOW (ref >90)
GLUCOSE: 111 mg/dL — AB (ref 70–99)
Potassium: 3.9 mEq/L (ref 3.7–5.3)
Sodium: 140 mEq/L (ref 137–147)
Total Bilirubin: 0.5 mg/dL (ref 0.3–1.2)
Total Protein: 7.6 g/dL (ref 6.0–8.3)

## 2013-04-27 LAB — CBC WITH DIFFERENTIAL/PLATELET
Basophils Absolute: 0 10*3/uL (ref 0.0–0.1)
Basophils Relative: 0 % (ref 0–1)
EOS ABS: 0.3 10*3/uL (ref 0.0–0.7)
EOS PCT: 2 % (ref 0–5)
HEMATOCRIT: 37.6 % (ref 36.0–46.0)
HEMOGLOBIN: 12.7 g/dL (ref 12.0–15.0)
LYMPHS ABS: 1.8 10*3/uL (ref 0.7–4.0)
Lymphocytes Relative: 18 % (ref 12–46)
MCH: 28.6 pg (ref 26.0–34.0)
MCHC: 33.8 g/dL (ref 30.0–36.0)
MCV: 84.7 fL (ref 78.0–100.0)
MONO ABS: 1 10*3/uL (ref 0.1–1.0)
MONOS PCT: 10 % (ref 3–12)
NEUTROS PCT: 70 % (ref 43–77)
Neutro Abs: 7.3 10*3/uL (ref 1.7–7.7)
Platelets: 221 10*3/uL (ref 150–400)
RBC: 4.44 MIL/uL (ref 3.87–5.11)
RDW: 13.3 % (ref 11.5–15.5)
WBC: 10.4 10*3/uL (ref 4.0–10.5)

## 2013-04-27 MED ORDER — DEXTROSE 5 % IV SOLN
1.0000 g | Freq: Once | INTRAVENOUS | Status: AC
  Administered 2013-04-27: 1 g via INTRAVENOUS
  Filled 2013-04-27: qty 10

## 2013-04-27 NOTE — ED Provider Notes (Signed)
CSN: [id number]     Arrival date & time 04/27/13  2152 History  This chart was scribed for non-physician practitioner Junious SilkHannah Graclynn Vanantwerp, PA-C working with Darlys Galesavid Masneri, MD by Joaquin MusicKristina Sanchez-Matthews, ED Scribe. This patient was seen in room TR11C/TR11C and the patient's care was started at 10:31 PM .   Chief Complaint  Patient presents with  . Post-op Problem   The history is provided by the patient. No language interpreter was used.   HPI Comments: [name] is a 38 y.o. female with a hx of GERD and gallbladder stones who presents to the Emergency Department complaining of drainage and redness with some chills, nausea, and abd pain to recent operation site that began 2 days ago. Pt states she had an appendectomy 3 weeks. She states her discharge from her incision is yellow-white x 2 days ago and began having bloody purulent discharge from incision that began today. Pt states she is in slight pain that she describes as soreness, stretchy cold feeling. Pt states she still has some abd pain. She states her las BM was today and reports having "trouble pushing" but states was otherwise normal. Pt denies fever and emesis.   Past Medical History  Diagnosis Date  . Obesity   . Recurrent upper respiratory infection (URI)     cold sx 2 wks ago   . Heartburn   . Headache(784.0)     tx w/OTC meds prn  . Depression     no meds  . 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 MRSA (methicillin resistant staph aureus) culture positive    Past Surgical History  Procedure Laterality Date  . Wisdom tooth extraction    . Svd      x 4 -3 epidurals and 1 spinal w/ SVD  . Laparoscopic tubal ligation  06/11/2011    Procedure: LAPAROSCOPIC TUBAL LIGATION;  Surgeon: Antionette CharLisa [name], MD;  Location: [facility];  Service: Gynecology;  Laterality: N/A;  . Laparoscopic appendectomy N/A 04/06/2013    Procedure: APPENDECTOMY LAPAROSCOPIC;  Surgeon: Clovis Puhomas A. Cornett, MD;  Location: [facility] OR;   Service: General;  Laterality: N/A;   Family History  Problem Relation Age of Onset  . Diabetes Mother   . Hyperlipidemia Mother    History  Substance Use Topics  . Smoking status: Current Some Day Smoker -- 0.25 packs/day for 4 years    Types: Cigarettes    Last Attempt to Quit: 01/21/2011  . Smokeless tobacco: Never Used     Comment: about 2 cigarettes per day  . Alcohol Use: No   OB History   Grav Para Term Preterm Abortions TAB SAB Ect Mult Living   4 4 4       4      Review of Systems  Constitutional: Positive for chills. Negative for fever.  Gastrointestinal: Negative for vomiting.  Skin: Positive for color change and wound.  All other systems reviewed and are negative.    Allergies  Review of patient's allergies indicates no known allergies.  Home Medications   Current Outpatient Rx  Name  Route  Sig  Dispense  Refill  . acetaminophen (TYLENOL) 500 MG tablet   Oral   Take 1,000 mg by mouth every 6 (six) hours as needed for moderate pain.         Marland Kitchen. aspirin 325 MG tablet   Oral   Take 325 mg by mouth every 6 (six) hours as needed for moderate pain.         .Marland Kitchen  oxyCODONE-acetaminophen (ROXICET) 5-325 MG per tablet   Oral   Take 1-2 tablets by mouth every 4 (four) hours as needed for severe pain.   50 tablet   0    Triage Vitals:BP 122/104  Pulse 103  Temp(Src) 98 F (36.7 C) (Oral)  Resp 20  Wt 323 lb 1 oz (146.54 kg)  SpO2 98%  LMP 06/04/2012  Physical Exam  Nursing note and vitals reviewed. Constitutional: She is oriented to person, place, and time. She appears well-developed and well-nourished. No distress.  HENT:  Head: Normocephalic and atraumatic.  Right Ear: External ear normal.  Left Ear: External ear normal.  Nose: Nose normal.  Mouth/Throat: Oropharynx is clear and moist.  Eyes: Conjunctivae are normal.  Neck: Normal range of motion.  Cardiovascular: Normal rate, regular rhythm and normal heart sounds.   Pulmonary/Chest: Effort  normal and breath sounds normal. No stridor. No respiratory distress. She has no wheezes. She has no rales.  Abdominal: Soft. She exhibits no distension.  2cm incision site with bloody and purulent drainage with surrounding 6cm area of erythema,which is tender to palpation, blanches with palpation. No induration.  Musculoskeletal: Normal range of motion.  Neurological: She is alert and oriented to person, place, and time. She has normal strength.  Skin: Skin is warm and dry. She is not diaphoretic. No erythema.  Psychiatric: She has a normal mood and affect. Her behavior is normal.    ED Course  Procedures DIAGNOSTIC STUDIES: Oxygen Saturation is 98% on RA, normal by my interpretation.    COORDINATION OF CARE: 10:33 PM-Discussed treatment plan which includes will administer pain medication while in ED, labs, and contact surgeon for further evaluation. Pt agreed to plan.   Labs Review Labs Reviewed  COMPREHENSIVE METABOLIC PANEL - Abnormal; Notable for the following:    Glucose, Bld 111 (*)    Albumin 3.4 (*)    GFR calc non Af Amer 88 (*)    All other components within normal limits  CBC WITH DIFFERENTIAL   Imaging Review No results found.  EKG Interpretation   None      MDM   1. Infected incision   2. Cellulitis    Pt presents to ED with infected incision site from appendectomy 3 weeks ago.  Abd soft, non surgical. Pt without elevated WBC. Area of erythema was outlined with skin marker. Dr. Corliss Skains evaluated patient and will see in his office on Monday. Pt given Rocephin in ED and sent home with Keflex. Dr. Redgie Grayer evaluated patient and agrees with plan. Return instructions given. Vital signs stable for discharge. Patient / Family / Caregiver informed of clinical course, understand medical decision-making process, and agree with plan.    I personally performed the services described in this documentation, which was scribed in my presence. The recorded information has been  reviewed and is accurate.     Mora Bellman, PA-C 04/28/13 [phone]

## 2013-04-27 NOTE — ED Notes (Signed)
Pt states she had appendectomy 3 weeks ago, states 3 days ago yellow discharged started draining from incision. States today small amount of blood noted to drain. States the incision is more open than usual. Small yellow crust drainage noted around umbilicus. No drainaged noted at this time, incision is not approximated. No bleeding at this time. Small area of redness noted around incision site, redness marked with surgical pen.

## 2013-04-27 NOTE — ED Notes (Signed)
Pt reports she had appendectomy 3 weeks ago.  C/o yellowish- white discharge from surgical site (umbilicus) x 2 days.

## 2013-04-28 MED ORDER — CEPHALEXIN 250 MG PO CAPS: 250.0000 mg | ORAL_CAPSULE | Freq: Four times a day (QID) | ORAL | Status: DC

## 2013-04-28 NOTE — Discharge Instructions (Signed)
Cellulitis Cellulitis is an infection of the skin and the tissue beneath it. The infected area is usually red and tender. Cellulitis occurs most often in the arms and lower legs.  CAUSES  Cellulitis is caused by bacteria that enter the skin through cracks or cuts in the skin. The most common types of bacteria that cause cellulitis are Staphylococcus and Streptococcus. SYMPTOMS   Redness and warmth.  Swelling.  Tenderness or pain.  Fever. DIAGNOSIS  Your caregiver can usually determine what is wrong based on a physical exam. Blood tests may also be done. TREATMENT  Treatment usually involves taking an antibiotic medicine. HOME CARE INSTRUCTIONS   Take your antibiotics as directed. Finish them even if you start to feel better.  Keep the infected arm or leg elevated to reduce swelling.  Apply a warm cloth to the affected area up to 4 times per day to relieve pain.  Only take over-the-counter or prescription medicines for pain, discomfort, or fever as directed by your caregiver.  Keep all follow-up appointments as directed by your caregiver. SEEK MEDICAL CARE IF:   You notice red streaks coming from the infected area.  Your red area gets larger or turns dark in color.  Your bone or joint underneath the infected area becomes painful after the skin has healed.  Your infection returns in the same area or another area.  You notice a swollen bump in the infected area.  You develop new symptoms. SEEK IMMEDIATE MEDICAL CARE IF:   You have a fever.  You feel very sleepy.  You develop vomiting or diarrhea.  You have a general ill feeling (malaise) with muscle aches and pains. MAKE SURE YOU:   Understand these instructions.  Will watch your condition.  Will get help right away if you are not doing well or get worse. Document Released: 12/16/2004 Document Revised: 09/07/2011 Document Reviewed: 05/24/2011 [facility] Patient Information 2014 [place], [place].   Surgical Site  Infection A surgical site infection can occur after surgery. It happens in the part of the body where the surgery took place. Most patients who have surgery do not develop an infection.  SYMPTOMS  Redness and pain around the surgical site.  Drainage of cloudy fluid from the wound.  Fever. PREVENTION Before the procedure:  Tell your caregiver about any medical problems you may have. Health problems such as allergies, diabetes, and obesity could affect your surgery and treatment.  Quit smoking. Patients who smoke get more infections. Talk to your caregiver about how you can quit before your surgery.  Do not shave near the area where you will have surgery. Shaving with a razor can irritate your skin and make it easier to get an infection. Your caregiver may use an electric clipper to remove some of your hair immediately before surgery.  Ask if you will get antibiotic medicine. In most cases, you should get antibiotics within 60 minutes before surgery. Antibiotics should be stopped within 24 hours after surgery.  Your caregivers will clean their hands and arms up to their elbows with an antiseptic agent just before the surgery. Your caregivers will also clean their hands with soap and water or an alcohol-based hand rub before and after caring for each patient.  Your caregivers will wear hair covers, masks, gowns, and gloves during surgery to keep the surgery area clean.  Your caregivers will clean the skin at the surgery site with a soap that kills germs. After the procedure:  Make sure your caregivers clean their hands before  examining you. They may use soap and water or an alcohol-based hand rub. If you do not see your caregivers clean their hands, ask them to do so.  Make sure family and friends who visit you do not touch the surgical wound or dressings.  Ask family and friends to clean their hands with soap and water or an alcohol-based hand rub before and after visiting you.  Make  sure you know how to care for your wound before you leave the hospital. Your caregiver will tell you how to take care of your wound.  Make sure you know whom to contact if you have questions or problems after you get home. TREATMENT Most surgical site infections can be treated with antibiotics. Sometimes, patients need another surgery to treat the infection. HOME CARE INSTRUCTIONS Always clean your hands before and after caring for your wound. SEEK IMMEDIATE MEDICAL CARE IF: You have any symptoms of an infection, such as drainage, fever, or redness and pain at the surgery site. Document Released: 06/03/2010 Document Revised: 05/31/2011 Document Reviewed: 06/03/2010 [facility] Patient Information 2014 [place], [place].

## 2013-04-28 NOTE — ED Provider Notes (Signed)
Medical screening examination/treatment/procedure(s) were conducted as a shared visit with non-physician practitioner(s) and myself.  I personally evaluated the patient during the encounter.  EKG Interpretation   None       Umbilical port incision infection.  No systemic symptoms.  Eval by Gen Surg.  Abx given.  F/u as outpt with gen surg.  Darlys Galesavid Masneri, MD 04/28/13 1113

## 2013-04-28 NOTE — ED Notes (Signed)
Dr Corliss Skainssuei here to see patient.

## 2013-04-28 NOTE — Progress Notes (Signed)
Patient ID: Sandra CrossYvonne D Peloso, female   DOB: 10/24/1975, 38 y.o.   MRN: [id number]  The patient came to the Emergency Department because of the drainage from her umbilical incision.  She already had an Urgent Office appointment for Monday and a D[facility] clinic appointment for Tuesday.    Filed Vitals:   04/27/13 2200  BP: 122/104  Pulse: 103  Temp: 98 F (36.7 C)  Resp: 20   Lab Results  Component Value Date   WBC 10.4 04/27/2013   HGB 12.7 04/27/2013   HCT 37.6 04/27/2013   MCV 84.7 04/27/2013   PLT 221 04/27/2013    Her incision has completely opened and has some minimal yellowish drainage.  Minimal surrounding erythema.  No peritoneal signs.  Superficial wound infection/ dehiscence  I instructed the ED to start the patient on Keflex and she can follow-up next week as scheduled.  No need for admission.  Wilmon ArmsMatthew K. Corliss Skainssuei, MD, [facility]  General/ Trauma Surgery  04/28/2013 12:32 AM

## 2013-04-30 ENCOUNTER — Encounter (INDEPENDENT_AMBULATORY_CARE_PROVIDER_SITE_OTHER): Payer: Medicaid Other | Admitting: General Surgery

## 2013-05-01 ENCOUNTER — Encounter (INDEPENDENT_AMBULATORY_CARE_PROVIDER_SITE_OTHER): Payer: Self-pay | Admitting: *Deleted

## 2013-05-01 ENCOUNTER — Encounter (INDEPENDENT_AMBULATORY_CARE_PROVIDER_SITE_OTHER): Payer: Self-pay | Admitting: General Surgery

## 2013-05-01 ENCOUNTER — Ambulatory Visit (INDEPENDENT_AMBULATORY_CARE_PROVIDER_SITE_OTHER): Payer: Medicaid Other | Admitting: General Surgery

## 2013-05-01 VITALS — BP 120/64 | HR 72 | Temp 97.8°F | Resp 14 | Ht 67.75 in | Wt 323.8 lb

## 2013-05-01 DIAGNOSIS — K3580 Unspecified acute appendicitis: Secondary | ICD-10-CM

## 2013-05-01 NOTE — Progress Notes (Signed)
Norris CrossYVONNE D Row 02/12/1976 [id number] 05/01/2013   History of Present Illness: Norris CrossYvonne D Rowen is a  38 y.o. female who presents today status post lap appy by Dr. Harriette Bouillonhomas Cornett.   Pathology reveals Diagnosis: Appendix, Other than Incidental- ACUTE APPENDICITIS AND SEROSITIS.- THERE IS NO EVIDENCE OF MALIGNANCY.  04/06/2013   The patient is tolerating a regular diet, having normal bowel movements, has good pain control.  She  is back to most normal activities.   Physical Exam: BP 120/64  Pulse 72  Temp(Src) 97.8 F (36.6 C) (Oral)  Resp 14  Ht 5' 7.75" (1.721 m)  Wt 146.875 kg (323 lb 12.8 oz)  BMI 49.59 kg/m2  LMP 06/04/2012  Abd: soft, nontender, active bowel sounds, nondistended.  Umbilical incision opened and is about 3mm deep, with clean pink tissue at the base, some white colored fibrous tissue at the base also.  She was placed on Keflex last week for this in the ER. Impression: 1.  Acute appendicitis, s/p lap appy open umbilical incision. 2.Body mass index is 49.59 kg/(m^2).  Plan: She is able to return to normal activities. She may follow up in 2 weeks, She is to keep the area clean and dry.  She can clean with soap and water.  She can go back to work in 4 weeks from date of surgery.

## 2013-05-01 NOTE — Patient Instructions (Signed)
Clean site with soap and water then dress with dry dressing.  Keep this site clean and dry.

## 2013-05-15 ENCOUNTER — Encounter (INDEPENDENT_AMBULATORY_CARE_PROVIDER_SITE_OTHER): Payer: Medicaid Other

## 2013-06-20 ENCOUNTER — Encounter (HOSPITAL_COMMUNITY): Payer: Self-pay | Admitting: Emergency Medicine

## 2013-06-20 ENCOUNTER — Emergency Department (HOSPITAL_COMMUNITY)
Admission: EM | Admit: 2013-06-20 | Discharge: 2013-06-20 | Disposition: A | Discharge status: Home / Self Care | Payer: Medicaid Other | Source: Home / Self Care | Attending: Family Medicine | Admitting: Family Medicine

## 2013-06-20 DIAGNOSIS — N390 Urinary tract infection, site not specified: Secondary | ICD-10-CM

## 2013-06-20 LAB — POCT URINALYSIS DIP (DEVICE)
Bilirubin Urine: NEGATIVE
GLUCOSE, UA: NEGATIVE mg/dL
Hgb urine dipstick: NEGATIVE
Ketones, ur: NEGATIVE mg/dL
Nitrite: NEGATIVE
Protein, ur: NEGATIVE mg/dL
Specific Gravity, Urine: >=1.03 (ref 1.005–1.030)
UROBILINOGEN UA: 0.2 mg/dL (ref 0.0–1.0)
pH: 6.5 (ref 5.0–8.0)

## 2013-06-20 LAB — POCT PREGNANCY, URINE: PREG TEST UR: NEGATIVE

## 2013-06-20 MED ORDER — CEPHALEXIN 500 MG PO CAPS: 500.0000 mg | ORAL_CAPSULE | Freq: Two times a day (BID) | ORAL | Status: DC

## 2013-06-20 NOTE — Discharge Instructions (Signed)
Thank you for coming in today. Take Keflex twice daily for one week  Followup with OB/GYN if not improving If your belly pain worsens, or you have high fever, bad vomiting, blood in your stool or black tarry stool go to the Emergency Room.   Urinary Tract Infection Urinary tract infections (UTIs) can develop anywhere along your urinary tract. Your urinary tract is your body's drainage system for removing wastes and extra water. Your urinary tract includes two kidneys, two ureters, a bladder, and a urethra. Your kidneys are a pair of bean-shaped organs. Each kidney is about the size of your fist. They are located below your ribs, one on each side of your spine. CAUSES Infections are caused by microbes, which are microscopic organisms, including fungi, viruses, and bacteria. These organisms are so small that they can only be seen through a microscope. Bacteria are the microbes that most commonly cause UTIs. SYMPTOMS  Symptoms of UTIs may vary by age and gender of the patient and by the location of the infection. Symptoms in young women typically include a frequent and intense urge to urinate and a painful, burning feeling in the bladder or urethra during urination. Older women and men are more likely to be tired, shaky, and weak and have muscle aches and abdominal pain. A fever may mean the infection is in your kidneys. Other symptoms of a kidney infection include pain in your back or sides below the ribs, nausea, and vomiting. DIAGNOSIS To diagnose a UTI, your caregiver will ask you about your symptoms. Your caregiver also will ask to provide a urine sample. The urine sample will be tested for bacteria and white blood cells. White blood cells are made by your body to help fight infection. TREATMENT  Typically, UTIs can be treated with medication. Because most UTIs are caused by a bacterial infection, they usually can be treated with the use of antibiotics. The choice of antibiotic and length of  treatment depend on your symptoms and the type of bacteria causing your infection. HOME CARE INSTRUCTIONS  If you were prescribed antibiotics, take them exactly as your caregiver instructs you. Finish the medication even if you feel better after you have only taken some of the medication.  Drink enough water and fluids to keep your urine clear or pale yellow.  Avoid caffeine, tea, and carbonated beverages. They tend to irritate your bladder.  Empty your bladder often. Avoid holding urine for long periods of time.  Empty your bladder before and after sexual intercourse.  After a bowel movement, women should cleanse from front to back. Use each tissue only once. SEEK MEDICAL CARE IF:   You have back pain.  You develop a fever.  Your symptoms do not begin to resolve within 3 days. SEEK IMMEDIATE MEDICAL CARE IF:   You have severe back pain or lower abdominal pain.  You develop chills.  You have nausea or vomiting.  You have continued burning or discomfort with urination. MAKE SURE YOU:   Understand these instructions.  Will watch your condition.  Will get help right away if you are not doing well or get worse. Document Released: 12/16/2004 Document Revised: 09/07/2011 Document Reviewed: 04/16/2011 [facility] Patient Information 2014 [place], [place].

## 2013-06-20 NOTE — ED Provider Notes (Signed)
[name] is a 38 y.o. female who presents to Urgent Care today for right flank pain. Patient developed right flank pain this morning. The pain radiates to the groin. The pain is mild to moderate. She also notes some urinary urgency and frequency. She denies any fevers or chills nausea vomiting or diarrhea. She feels well otherwise. She is a history of appendectomy. She denies any history of kidney stones.   Past Medical History  Diagnosis Date  . Obesity   . Recurrent upper respiratory infection (URI)     cold sx 2 wks ago   . Heartburn   . Headache(784.0)     tx w/OTC meds prn  . Depression     no meds  . 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 MRSA (methicillin resistant staph aureus) culture positive    History  Substance Use Topics  . Smoking status: Current Some Day Smoker -- 0.25 packs/day for 4 years    Types: Cigarettes  . Smokeless tobacco: Never Used     Comment: about 2 cigarettes per day  . Alcohol Use: No   ROS as above Medications: No current facility-administered medications for this encounter.   Current Outpatient Prescriptions  Medication Sig Dispense Refill  . acetaminophen (TYLENOL) 500 MG tablet Take 1,000 mg by mouth every 6 (six) hours as needed for moderate pain.      . cephALEXin (KEFLEX) 250 MG capsule Take 1 capsule (250 mg total) by mouth 4 (four) times daily.  40 capsule  0  . cephALEXin (KEFLEX) 500 MG capsule Take 1 capsule (500 mg total) by mouth 2 (two) times daily.  14 capsule  0  . ibuprofen (ADVIL,MOTRIN) 200 MG tablet Take 400 mg by mouth every 6 (six) hours as needed (pain).      . mupirocin nasal ointment (BACTROBAN) 2 % Place 1 application into the nose 2 (two) times daily. Use one-half of tube in each nostril twice daily for five (5) days. After application, press sides of nose together and gently massage.      Marland Kitchen oxyCODONE-acetaminophen (PERCOCET/ROXICET) 5-325 MG per tablet Take 1-2 tablets by mouth every 4  (four) hours as needed for severe pain (pain).        Exam:  BP 114/69  Pulse 87  Temp(Src) 98.1 F (36.7 C) (Oral)  Resp 19  SpO2 100%  LMP 06/05/2013 Gen: Well NAD obese HEENT: EOMI,  MMM Lungs: Normal work of breathing. CTABL Heart: RRR no MRG Abd: NABS, Soft. NT, ND no CV tenderness to percussion. Exts: Brisk capillary refill, warm and well perfused.   Results for orders placed during the hospital encounter of 06/20/13 (from the past 24 hour(s))  POCT URINALYSIS DIP (DEVICE)     Status: Abnormal   Collection Time    06/20/13  7:48 PM      Result Value Ref Range   Glucose, UA NEGATIVE  NEGATIVE mg/dL   Bilirubin Urine NEGATIVE  NEGATIVE   Ketones, ur NEGATIVE  NEGATIVE mg/dL   Specific Gravity, Urine >=1.030  1.005 - 1.030   Hgb urine dipstick NEGATIVE  NEGATIVE   pH 6.5  5.0 - 8.0   Protein, ur NEGATIVE  NEGATIVE mg/dL   Urobilinogen, UA 0.2  0.0 - 1.0 mg/dL   Nitrite NEGATIVE  NEGATIVE   Leukocytes, UA TRACE (*) NEGATIVE  POCT PREGNANCY, URINE     Status: None   Collection Time    06/20/13  7:59 PM  Result Value Ref Range   Preg Test, Ur NEGATIVE  NEGATIVE   No results found.  Assessment and Plan: 38 y.o. female with UTI. Pain is most likely related to UTI. Patient declines a pelvic exam at this time preferring to followup with OB/GYN. I think this is reasonable plan. Will treat UTI with Keflex.  Discussed warning signs or symptoms. Please see discharge instructions. Patient expresses understanding.    Rodolph BongEvan S Klara Stjames, MD 06/20/13 2008

## 2013-06-20 NOTE — ED Notes (Signed)
Pt c/o RLQ pain since this am See physicians note Denies urinary sxs, gyn sxs, f/v/n/d Alert w/no signs of acute distress.

## 2014-01-21 ENCOUNTER — Encounter (HOSPITAL_COMMUNITY): Payer: Self-pay | Admitting: Emergency Medicine

## 2014-06-04 ENCOUNTER — Emergency Department (HOSPITAL_COMMUNITY)
Admission: EM | Admit: 2014-06-04 | Discharge: 2014-06-04 | Disposition: A | Discharge status: Home / Self Care | Payer: Medicaid Other | Source: Home / Self Care | Attending: Emergency Medicine | Admitting: Emergency Medicine

## 2014-06-04 ENCOUNTER — Encounter (HOSPITAL_COMMUNITY): Payer: Self-pay | Admitting: *Deleted

## 2014-06-04 DIAGNOSIS — L02439 Carbuncle of limb, unspecified: Secondary | ICD-10-CM | POA: Diagnosis not present

## 2014-06-04 DIAGNOSIS — L02429 Furuncle of limb, unspecified: Secondary | ICD-10-CM

## 2014-06-04 MED ORDER — SULFAMETHOXAZOLE-TRIMETHOPRIM 800-160 MG PO TABS: 1.0000 | ORAL_TABLET | Freq: Two times a day (BID) | ORAL | Status: DC

## 2014-06-04 MED ORDER — CHLORHEXIDINE GLUCONATE 2 % EX SOLN: CUTANEOUS | Status: DC

## 2014-06-04 NOTE — ED Notes (Signed)
C/o abscess R axilla.  It came up 4 days ago.  It drained Sunday.  Now appears to have a hole in it.

## 2014-06-04 NOTE — ED Provider Notes (Signed)
CSN: [id number]     Arrival date & time 06/04/14  1841 History   First MD Initiated Contact with Patient 06/04/14 2025     No chief complaint on file. CC: boil  (Consider location/radiation/quality/duration/timing/severity/associated sxs/prior Treatment) HPI  She is a 39 year old woman here for evaluation of right axillary boil. She states it popped up 3-4 days ago. She has been applying warm compresses and it drained yesterday. She states it is still tender and red. She denies any fevers or chills. No nausea or vomiting. She also has a small spot developing on her right breast and under her left arm as well.  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  Past Medical History  Diagnosis Date  . Obesity   . Recurrent upper respiratory infection (URI)     cold sx 2 wks ago   . Heartburn   . Headache(784.0)     tx w/OTC meds prn  . Depression     no meds  . 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 MRSA (methicillin resistant staph aureus) culture positive    Past Surgical History  Procedure Laterality Date  . Wisdom tooth extraction    . Svd      x 4 -3 epidurals and 1 spinal w/ SVD  . Laparoscopic tubal ligation  06/11/2011    Procedure: LAPAROSCOPIC TUBAL LIGATION;  Surgeon: Antionette CharLisa [name], MD;  Location: [facility];  Service: Gynecology;  Laterality: N/A;  . Laparoscopic appendectomy N/A 04/06/2013    Procedure: APPENDECTOMY LAPAROSCOPIC;  Surgeon: Clovis Puhomas A. Cornett, MD;  Location: [facility];  Service: General;  Laterality: N/A;  . Tubal ligation     Family History  Problem Relation Age of Onset  . Diabetes Mother   . Hyperlipidemia Mother   . Cancer Maternal Aunt     thyroid  . Cancer Maternal Grandmother     breast   History  Substance Use Topics  . Smoking status: Current Some Day Smoker -- 0.25 packs/day for 4 years    Types: Cigarettes  . Smokeless tobacco: Never Used     Comment: about 2 cigarettes  per day  . Alcohol Use: No   OB History    Gravida Para Term Preterm AB TAB SAB Ectopic Multiple Living   4 4 4       4      Review of Systems  Constitutional: Negative for fever.  Gastrointestinal: Negative for nausea.  Skin: Positive for wound.    Allergies  Review of patient's allergies indicates no known allergies.  Home Medications   Prior to Admission medications   Medication Sig Start Date End Date Taking? Authorizing Provider  acetaminophen (TYLENOL) 500 MG tablet Take 1,000 mg by mouth every 6 (six) hours as needed for moderate pain.    Historical Provider, MD  Chlorhexidine Gluconate 2 % SOLN Wash with the solution once a week for 1 month. 06/04/14   Charm RingsErin J Honig, MD  ibuprofen (ADVIL,MOTRIN) 200 MG tablet Take 400 mg by mouth every 6 (six) hours as needed (pain).    Historical Provider, MD  mupirocin nasal ointment (BACTROBAN) 2 % Place 1 application into the nose 2 (two) times daily. Use one-half of tube in each nostril twice daily for five (5) days. After application, press sides of nose together and gently massage.    Historical Provider, MD  oxyCODONE-acetaminophen (PERCOCET/ROXICET) 5-325 MG per tablet Take 1-2 tablets by mouth  every 4 (four) hours as needed for severe pain (pain).    Historical Provider, MD  sulfamethoxazole-trimethoprim (SEPTRA DS) 800-160 MG per tablet Take 1 tablet by mouth 2 (two) times daily. 06/04/14   Charm Rings, MD   BP 139/88 mmHg  Pulse 86  Temp(Src) 98.1 F (36.7 C) (Oral)  Resp 20  SpO2 97% Physical Exam  ED Course  Procedures (including critical care time) Labs Review Labs Reviewed - No data to display  Imaging Review No results found.   MDM   1. Boil, axilla    No fluctuance amenable to drainage. We'll treat with Bactrim for 10 days. Recommended chlorhexidine wash weekly for the next month. Follow-up as needed.    Charm Rings, MD 06/04/14 2101

## 2014-06-04 NOTE — Discharge Instructions (Signed)
The abscess has drained. Please take Bactrim twice a day for 10 days. Continue the warm compresses for the next 2-3 days. Use chlorhexidine wash once a week month. Follow-up as needed.

## 2014-07-21 ENCOUNTER — Emergency Department (HOSPITAL_COMMUNITY)
Admission: EM | Admit: 2014-07-21 | Discharge: 2014-07-21 | Disposition: A | Discharge status: Home / Self Care | Payer: Medicaid Other | Attending: Emergency Medicine | Admitting: Emergency Medicine

## 2014-07-21 ENCOUNTER — Encounter (HOSPITAL_COMMUNITY): Payer: Self-pay

## 2014-07-21 DIAGNOSIS — Z8659 Personal history of other mental and behavioral disorders: Secondary | ICD-10-CM | POA: Diagnosis not present

## 2014-07-21 DIAGNOSIS — L03115 Cellulitis of right lower limb: Secondary | ICD-10-CM

## 2014-07-21 DIAGNOSIS — E669 Obesity, unspecified: Secondary | ICD-10-CM | POA: Diagnosis not present

## 2014-07-21 DIAGNOSIS — Z792 Long term (current) use of antibiotics: Secondary | ICD-10-CM | POA: Insufficient documentation

## 2014-07-21 DIAGNOSIS — Z8709 Personal history of other diseases of the respiratory system: Secondary | ICD-10-CM | POA: Diagnosis not present

## 2014-07-21 DIAGNOSIS — Z791 Long term (current) use of non-steroidal anti-inflammatories (NSAID): Secondary | ICD-10-CM | POA: Diagnosis not present

## 2014-07-21 DIAGNOSIS — L02415 Cutaneous abscess of right lower limb: Secondary | ICD-10-CM | POA: Diagnosis not present

## 2014-07-21 DIAGNOSIS — Z8614 Personal history of Methicillin resistant Staphylococcus aureus infection: Secondary | ICD-10-CM | POA: Insufficient documentation

## 2014-07-21 DIAGNOSIS — Z79899 Other long term (current) drug therapy: Secondary | ICD-10-CM | POA: Insufficient documentation

## 2014-07-21 DIAGNOSIS — Z720 Tobacco use: Secondary | ICD-10-CM | POA: Insufficient documentation

## 2014-07-21 MED ORDER — DOXYCYCLINE HYCLATE 100 MG PO CAPS: 100.0000 mg | ORAL_CAPSULE | Freq: Two times a day (BID) | ORAL | Status: DC

## 2014-07-21 MED ORDER — MORPHINE SULFATE 4 MG/ML IJ SOLN
4.0000 mg | Freq: Once | INTRAMUSCULAR | Status: AC
Start: 2014-07-21 — End: 2014-07-21
  Administered 2014-07-21: 4 mg via INTRAVENOUS
  Filled 2014-07-21: qty 1

## 2014-07-21 MED ORDER — LIDOCAINE-EPINEPHRINE (PF) 2 %-1:200000 IJ SOLN
10.0000 mL | Freq: Once | INTRAMUSCULAR | Status: AC
  Administered 2014-07-21: 10 mL
  Filled 2014-07-21: qty 20

## 2014-07-21 MED ORDER — ONDANSETRON HCL 4 MG/2ML IJ SOLN
4.0000 mg | Freq: Once | INTRAMUSCULAR | Status: AC
  Administered 2014-07-21: 4 mg via INTRAVENOUS
  Filled 2014-07-21: qty 2

## 2014-07-21 MED ORDER — CEFAZOLIN SODIUM-DEXTROSE 2-3 GM-% IV SOLR
2.0000 g | Freq: Once | INTRAVENOUS | Status: AC
  Administered 2014-07-21: 2 g via INTRAVENOUS
  Filled 2014-07-21: qty 50

## 2014-07-21 NOTE — ED Notes (Signed)
Pt has a spot on her RLE that she noticed last Wednesday and since has gotten more painful and swollen.

## 2014-07-21 NOTE — ED Notes (Signed)
Pt reports she hit her right lower leg on a table at work on Wednesday, the site then blistered and popped the next day with clear drainage. Site has gotten worse since yesterday and now has a scab about 6mm with redness and swelling. No active drainage noted.

## 2014-07-21 NOTE — Discharge Instructions (Signed)
It was our pleasure to provide your ER care today - we hope that you feel better.  Keep area very clean.  Take antibiotic (doxycycline) as prescribed.  Take motrin or aleve as need for pain.  Follow up with primary care doctor, or urgent care, in 2 days time for wound re-check and packing removal.  Return to ER right away if worse, high fevers, spreading redness, severe pain, other concern.  You were given pain medication in the ER - no driving for the next 4 hours.        Cellulitis Cellulitis is an infection of the skin and the tissue beneath it. The infected area is usually red and tender. Cellulitis occurs most often in the arms and lower legs.  CAUSES  Cellulitis is caused by bacteria that enter the skin through cracks or cuts in the skin. The most common types of bacteria that cause cellulitis are staphylococci and streptococci. SIGNS AND SYMPTOMS   Redness and warmth.  Swelling.  Tenderness or pain.  Fever. DIAGNOSIS  Your health care provider can usually determine what is wrong based on a physical exam. Blood tests may also be done. TREATMENT  Treatment usually involves taking an antibiotic medicine. HOME CARE INSTRUCTIONS   Take your antibiotic medicine as directed by your health care provider. Finish the antibiotic even if you start to feel better.  Keep the infected arm or leg elevated to reduce swelling.  Apply a warm cloth to the affected area up to 4 times per day to relieve pain.  Take medicines only as directed by your health care provider.  Keep all follow-up visits as directed by your health care provider. SEEK MEDICAL CARE IF:   You notice red streaks coming from the infected area.  Your red area gets larger or turns dark in color.  Your bone or joint underneath the infected area becomes painful after the skin has healed.  Your infection returns in the same area or another area.  You notice a swollen bump in the infected area.  You  develop new symptoms.  You have a fever. SEEK IMMEDIATE MEDICAL CARE IF:   You feel very sleepy.  You develop vomiting or diarrhea.  You have a general ill feeling (malaise) with muscle aches and pains. MAKE SURE YOU:   Understand these instructions.  Will watch your condition.  Will get help right away if you are not doing well or get worse. Document Released: 12/16/2004 Document Revised: 07/23/2013 Document Reviewed: 05/24/2011 [facility] Patient Information 2015 [place], [place]. This information is not intended to replace advice given to you by your health care provider. Make sure you discuss any questions you have with your health care provider.      Abscess An abscess is an infected area that contains a collection of pus and debris.It can occur in almost any part of the body. An abscess is also known as a furuncle or boil. CAUSES  An abscess occurs when tissue gets infected. This can occur from blockage of oil or sweat glands, infection of hair follicles, or a minor injury to the skin. As the body tries to fight the infection, pus collects in the area and creates pressure under the skin. This pressure causes pain. People with weakened immune systems have difficulty fighting infections and get certain abscesses more often.  SYMPTOMS Usually an abscess develops on the skin and becomes a painful mass that is red, warm, and tender. If the abscess forms under the skin, you may feel a  moveable soft area under the skin. Some abscesses break open (rupture) on their own, but most will continue to get worse without care. The infection can spread deeper into the body and eventually into the bloodstream, causing you to feel ill.  DIAGNOSIS  Your caregiver will take your medical history and perform a physical exam. A sample of fluid may also be taken from the abscess to determine what is causing your infection. TREATMENT  Your caregiver may prescribe antibiotic medicines to fight the  infection. However, taking antibiotics alone usually does not cure an abscess. Your caregiver may need to make a small cut (incision) in the abscess to drain the pus. In some cases, gauze is packed into the abscess to reduce pain and to continue draining the area. HOME CARE INSTRUCTIONS   Only take over-the-counter or prescription medicines for pain, discomfort, or fever as directed by your caregiver.  If you were prescribed antibiotics, take them as directed. Finish them even if you start to feel better.  If gauze is used, follow your caregiver's directions for changing the gauze.  To avoid spreading the infection:  Keep your draining abscess covered with a bandage.  Wash your hands well.  Do not share personal care items, towels, or whirlpools with others.  Avoid skin contact with others.  Keep your skin and clothes clean around the abscess.  Keep all follow-up appointments as directed by your caregiver. SEEK MEDICAL CARE IF:   You have increased pain, swelling, redness, fluid drainage, or bleeding.  You have muscle aches, chills, or a general ill feeling.  You have a fever. MAKE SURE YOU:   Understand these instructions.  Will watch your condition.  Will get help right away if you are not doing well or get worse. Document Released: 12/16/2004 Document Revised: 09/07/2011 Document Reviewed: 05/21/2011 [facility] Patient Information 2015 [place], [place]. This information is not intended to replace advice given to you by your health care provider. Make sure you discuss any questions you have with your health care provider.  Abscess An abscess is an infected area that contains a collection of pus and debris.It can occur in almost any part of the body. An abscess is also known as a furuncle or boil. CAUSES  An abscess occurs when tissue gets infected. This can occur from blockage of oil or sweat glands, infection of hair follicles, or a minor injury to the skin. As the body  tries to fight the infection, pus collects in the area and creates pressure under the skin. This pressure causes pain. People with weakened immune systems have difficulty fighting infections and get certain abscesses more often.  SYMPTOMS Usually an abscess develops on the skin and becomes a painful mass that is red, warm, and tender. If the abscess forms under the skin, you may feel a moveable soft area under the skin. Some abscesses break open (rupture) on their own, but most will continue to get worse without care. The infection can spread deeper into the body and eventually into the bloodstream, causing you to feel ill.  DIAGNOSIS  Your caregiver will take your medical history and perform a physical exam. A sample of fluid may also be taken from the abscess to determine what is causing your infection. TREATMENT  Your caregiver may prescribe antibiotic medicines to fight the infection. However, taking antibiotics alone usually does not cure an abscess. Your caregiver may need to make a small cut (incision) in the abscess to drain the pus. In some cases, gauze is  packed into the abscess to reduce pain and to continue draining the area. HOME CARE INSTRUCTIONS   Only take over-the-counter or prescription medicines for pain, discomfort, or fever as directed by your caregiver.  If you were prescribed antibiotics, take them as directed. Finish them even if you start to feel better.  If gauze is used, follow your caregiver's directions for changing the gauze.  To avoid spreading the infection:  Keep your draining abscess covered with a bandage.  Wash your hands well.  Do not share personal care items, towels, or whirlpools with others.  Avoid skin contact with others.  Keep your skin and clothes clean around the abscess.  Keep all follow-up appointments as directed by your caregiver. SEEK MEDICAL CARE IF:   You have increased pain, swelling, redness, fluid drainage, or bleeding.  You have  muscle aches, chills, or a general ill feeling.  You have a fever. MAKE SURE YOU:   Understand these instructions.  Will watch your condition.  Will get help right away if you are not doing well or get worse. Document Released: 12/16/2004 Document Revised: 09/07/2011 Document Reviewed: 05/21/2011 [facility] Patient Information 2015 [place], [place]. This information is not intended to replace advice given to you by your health care provider. Make sure you discuss any questions you have with your health care provider.

## 2014-07-21 NOTE — ED Provider Notes (Signed)
CSN: [id number]     Arrival date & time 07/21/14  1152 History   First MD Initiated Contact with Patient 07/21/14 1241     Chief Complaint  Patient presents with  . Cellulitis     (Consider location/radiation/quality/duration/timing/severity/associated sxs/prior Treatment) The history is provided by the patient.  Patient w hx abscesses, c/o noticing a red, swollen area to right lower leg anteriorly 3-4 days ago. Pt has cracked skin/abrasions to bilateral anterior lower legs.  In past couple days, pt notes spreading redness outwards from around the red, swollen area/abscess. No fever or chills. No nv. Normal appetite. Does not feel sick or ill. No leg/calf pain or swelling. No lymphangitis or red streaking up leg. No cp or sob. No faintness or dizziness.      Past Medical History  Diagnosis Date  . Obesity   . Recurrent upper respiratory infection (URI)     cold sx 2 wks ago   . Heartburn   . Headache(784.0)     tx w/OTC meds prn  . Depression     no meds  . 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 MRSA (methicillin resistant staph aureus) culture positive    Past Surgical History  Procedure Laterality Date  . Wisdom tooth extraction    . Svd      x 4 -3 epidurals and 1 spinal w/ SVD  . Laparoscopic tubal ligation  06/11/2011    Procedure: LAPAROSCOPIC TUBAL LIGATION;  Surgeon: Antionette CharLisa [name], MD;  Location: [facility];  Service: Gynecology;  Laterality: N/A;  . Laparoscopic appendectomy N/A 04/06/2013    Procedure: APPENDECTOMY LAPAROSCOPIC;  Surgeon: Clovis Puhomas A. Cornett, MD;  Location: [facility];  Service: General;  Laterality: N/A;  . Tubal ligation     Family History  Problem Relation Age of Onset  . Diabetes Mother   . Hyperlipidemia Mother   . Hypertension Mother   . Cancer Maternal Aunt     thyroid  . Cancer Maternal Grandmother     breast  . Diabetes Father    History  Substance Use Topics  . Smoking status: Current Some Day Smoker --  0.20 packs/day for 4 years    Types: Cigarettes  . Smokeless tobacco: Never Used     Comment: about 2 cigarettes per day  . Alcohol Use: No   OB History    Gravida Para Term Preterm AB TAB SAB Ectopic Multiple Living   4 4 4       4      Review of Systems  Constitutional: Negative for fever and chills.  HENT: Negative for sore throat.   Eyes: Negative for redness.  Respiratory: Negative for shortness of breath.   Cardiovascular: Negative for chest pain.  Gastrointestinal: Negative for vomiting, abdominal pain and diarrhea.  Genitourinary: Negative for flank pain.  Musculoskeletal: Negative for back pain and neck pain.  Skin: Negative for rash.  Neurological: Negative for headaches.  Hematological: Does not bruise/bleed easily.  Psychiatric/Behavioral: Negative for confusion.      Allergies  Review of patient's allergies indicates no known allergies.  Home Medications   Prior to Admission medications   Medication Sig Start Date End Date Taking? Authorizing Provider  acetaminophen (TYLENOL) 500 MG tablet Take 1,000 mg by mouth every 6 (six) hours as needed for moderate pain.    Historical Provider, MD  Chlorhexidine Gluconate 2 % SOLN Wash with the solution once a week for 1 month. 06/04/14   Finis BudErin J  Piedad Climes, MD  cyclobenzaprine (FLEXERIL) 10 MG tablet Take 10 mg by mouth 2 (two) times daily.    Historical Provider, MD  ibuprofen (ADVIL,MOTRIN) 200 MG tablet Take 400 mg by mouth every 6 (six) hours as needed (pain).    Historical Provider, MD  mupirocin nasal ointment (BACTROBAN) 2 % Place 1 application into the nose 2 (two) times daily. Use one-half of tube in each nostril twice daily for five (5) days. After application, press sides of nose together and gently massage.    Historical Provider, MD  naproxen (NAPROSYN) 500 MG tablet Take 500 mg by mouth 2 (two) times daily with a meal.    Historical Provider, MD  oxyCODONE-acetaminophen (PERCOCET/ROXICET) 5-325 MG per tablet Take 1-2  tablets by mouth every 4 (four) hours as needed for severe pain (pain).    Historical Provider, MD  sulfamethoxazole-trimethoprim (SEPTRA DS) 800-160 MG per tablet Take 1 tablet by mouth 2 (two) times daily. 06/04/14   Charm Rings, MD   BP 124/54 mmHg  Pulse 85  Temp(Src) 97.8 F (36.6 C) (Oral)  Resp 20  Ht  (1.702 m)  Wt 318 lb 1.6 oz (144.289 kg)  BMI 49.81 kg/m2  SpO2 99%  LMP 06/30/2014 Physical Exam  Constitutional: She appears well-developed and well-nourished. No distress.  HENT:  Head: Atraumatic.  Eyes: Conjunctivae are normal. No scleral icterus.  Neck: Neck supple. No tracheal deviation present.  Cardiovascular: Normal rate, regular rhythm, normal heart sounds and intact distal pulses.   Pulmonary/Chest: Effort normal and breath sounds normal. No respiratory distress.  Abdominal: Normal appearance. She exhibits no distension. There is no tenderness.  Musculoskeletal: She exhibits no edema.  Patient w small scab and associated abscess to right lower leg anteriorly, approximately 3 cm diameter, with surrounding cellulitis right lower leg/ankle. Distal pulses palp. No calf swelling or tenderness. No crepitus.   Neurological: She is alert.  Skin: Skin is warm and dry. No rash noted. She is not diaphoretic.  Psychiatric: She has a normal mood and affect.  Nursing note and vitals reviewed.   ED Course  Procedures (including critical care time) Labs Review     MDM   I and D.  Reviewed nursing notes and prior charts for additional history.  Confirmed nkda w pt.     INCISION AND DRAINAGE Performed by: Suzi Roots Consent: Verbal consent obtained. Risks and benefits: risks, benefits and alternatives were discussed Type: abscess  Body area: right lower leg  Anesthesia: local infiltration  Incision was made with a scalpel.  Local anesthetic: lidocaine 2% w epinephrine  Anesthetic total: 3 ml  Complexity: complex Blunt dissection to break up  loculations  Drainage: purulent  Drainage amount: moderat  Packing material: 1/4 in iodoform gauze  Patient tolerance: Patient tolerated the procedure well with no immediate complications.   Sterile dressing.   Morphine iv for pain (pt has ride, does not have to drive). zofran iv.  Ancef.  Recheck pt comfortable, afeb, and appears stable for d/c.   Wound check/packing removal in 2 days.  Abx.   Given hx mrsa/abscesses, will give rx doxy.      Cathren Laine, MD 07/21/14 1336

## 2014-07-23 ENCOUNTER — Encounter (HOSPITAL_COMMUNITY): Payer: Self-pay | Admitting: *Deleted

## 2014-07-23 ENCOUNTER — Emergency Department (HOSPITAL_COMMUNITY)
Admission: EM | Admit: 2014-07-23 | Discharge: 2014-07-23 | Disposition: A | Discharge status: Home / Self Care | Payer: Medicaid Other | Source: Home / Self Care

## 2014-07-23 DIAGNOSIS — Z5189 Encounter for other specified aftercare: Secondary | ICD-10-CM

## 2014-07-23 NOTE — ED Notes (Signed)
Pt   Here  For  Recheck  Of  i  And  D  Of  Her  r  Lower  Leg   Pt  States  The  Wound  Is  Getting  Better       Redness  And  Swelling  Are  Better  She  States      pt  Reports  Taking  meds  As  She  Was  Directed

## 2014-07-23 NOTE — ED Provider Notes (Signed)
CSN: [id number]     Arrival date & time 07/23/14  1548 History   None    Chief Complaint  Patient presents with  . Wound Check   (Consider location/radiation/quality/duration/timing/severity/associated sxs/prior Treatment) Patient is a 39 y.o. female presenting with wound check. The history is provided by the patient.  Wound Check This is a new problem. The current episode started 2 days ago. The problem has been rapidly improving. Associated symptoms comments: Right lower leg abscess , s/p i+d on 5/1, local erythema nearly resolved, much less drainage and tenderness..    Past Medical History  Diagnosis Date  . Obesity   . Recurrent upper respiratory infection (URI)     cold sx 2 wks ago   . Heartburn   . Headache(784.0)     tx w/OTC meds prn  . Depression     no meds  . 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 MRSA (methicillin resistant staph aureus) culture positive    Past Surgical History  Procedure Laterality Date  . Wisdom tooth extraction    . Svd      x 4 -3 epidurals and 1 spinal w/ SVD  . Laparoscopic tubal ligation  06/11/2011    Procedure: LAPAROSCOPIC TUBAL LIGATION;  Surgeon: Antionette CharLisa [name], MD;  Location: [facility];  Service: Gynecology;  Laterality: N/A;  . Laparoscopic appendectomy N/A 04/06/2013    Procedure: APPENDECTOMY LAPAROSCOPIC;  Surgeon: Clovis Puhomas A. Cornett, MD;  Location: [facility];  Service: General;  Laterality: N/A;  . Tubal ligation     Family History  Problem Relation Age of Onset  . Diabetes Mother   . Hyperlipidemia Mother   . Hypertension Mother   . Cancer Maternal Aunt     thyroid  . Cancer Maternal Grandmother     breast  . Diabetes Father    History  Substance Use Topics  . Smoking status: Current Some Day Smoker -- 0.20 packs/day for 4 years    Types: Cigarettes  . Smokeless tobacco: Never Used     Comment: about 2 cigarettes per day  . Alcohol Use: No   OB History    Gravida Para Term Preterm AB  TAB SAB Ectopic Multiple Living   4 4 4       4      Review of Systems  Skin: Positive for wound.    Allergies  Review of patient's allergies indicates no known allergies.  Home Medications   Prior to Admission medications   Medication Sig Start Date End Date Taking? Authorizing Provider  acetaminophen (TYLENOL) 500 MG tablet Take 1,000 mg by mouth every 6 (six) hours as needed for moderate pain.    Historical Provider, MD  Chlorhexidine Gluconate 2 % SOLN Wash with the solution once a week for 1 month. 06/04/14   Charm RingsErin J Honig, MD  cyclobenzaprine (FLEXERIL) 10 MG tablet Take 10 mg by mouth 2 (two) times daily.    Historical Provider, MD  doxycycline (VIBRAMYCIN) 100 MG capsule Take 1 capsule (100 mg total) by mouth 2 (two) times daily. 07/21/14   Cathren LaineKevin Steinl, MD  ibuprofen (ADVIL,MOTRIN) 200 MG tablet Take 400 mg by mouth every 6 (six) hours as needed (pain).    Historical Provider, MD  mupirocin nasal ointment (BACTROBAN) 2 % Place 1 application into the nose 2 (two) times daily. Use one-half of tube in each nostril twice daily for five (5) days. After application, press sides of nose together and gently massage.  Historical Provider, MD  naproxen (NAPROSYN) 500 MG tablet Take 500 mg by mouth 2 (two) times daily with a meal.    Historical Provider, MD  oxyCODONE-acetaminophen (PERCOCET/ROXICET) 5-325 MG per tablet Take 1-2 tablets by mouth every 4 (four) hours as needed for severe pain (pain).    Historical Provider, MD  sulfamethoxazole-trimethoprim (SEPTRA DS) 800-160 MG per tablet Take 1 tablet by mouth 2 (two) times daily. 06/04/14   Charm Rings, MD   BP 112/79 mmHg  Pulse 95  Temp(Src) 98.2 F (36.8 C) (Oral)  Resp 16  SpO2 97%  LMP 06/30/2014 Physical Exam  Constitutional: She is oriented to person, place, and time. She appears well-developed and well-nourished.  Musculoskeletal: She exhibits no tenderness.  Neurological: She is alert and oriented to person, place, and time.   Skin: Skin is warm and dry.  Open wound to rll improving, min local erythema.  Nursing note and vitals reviewed.   ED Course  Procedures (including critical care time) Labs Review Labs Reviewed - No data to display  Imaging Review No results found.   MDM   1. Wound check, abscess        Linna Hoff, MD 07/23/14 [phone]

## 2014-07-23 NOTE — Discharge Instructions (Signed)
Finish antibiotic, continue local scrub washing twice daily, return as needed.

## 2014-10-08 ENCOUNTER — Emergency Department (HOSPITAL_COMMUNITY)
Admission: EM | Admit: 2014-10-08 | Discharge: 2014-10-08 | Disposition: A | Discharge status: Home / Self Care | Payer: Medicaid Other | Attending: Emergency Medicine | Admitting: Emergency Medicine

## 2014-10-08 ENCOUNTER — Encounter (HOSPITAL_COMMUNITY): Payer: Self-pay | Admitting: Emergency Medicine

## 2014-10-08 DIAGNOSIS — Z791 Long term (current) use of non-steroidal anti-inflammatories (NSAID): Secondary | ICD-10-CM | POA: Diagnosis not present

## 2014-10-08 DIAGNOSIS — Z79899 Other long term (current) drug therapy: Secondary | ICD-10-CM | POA: Diagnosis not present

## 2014-10-08 DIAGNOSIS — S39012A Strain of muscle, fascia and tendon of lower back, initial encounter: Secondary | ICD-10-CM | POA: Insufficient documentation

## 2014-10-08 DIAGNOSIS — Z720 Tobacco use: Secondary | ICD-10-CM | POA: Diagnosis not present

## 2014-10-08 DIAGNOSIS — Z792 Long term (current) use of antibiotics: Secondary | ICD-10-CM | POA: Insufficient documentation

## 2014-10-08 DIAGNOSIS — Z8614 Personal history of Methicillin resistant Staphylococcus aureus infection: Secondary | ICD-10-CM | POA: Insufficient documentation

## 2014-10-08 DIAGNOSIS — Y92410 Unspecified street and highway as the place of occurrence of the external cause: Secondary | ICD-10-CM | POA: Diagnosis not present

## 2014-10-08 DIAGNOSIS — Y9389 Activity, other specified: Secondary | ICD-10-CM | POA: Diagnosis not present

## 2014-10-08 DIAGNOSIS — Z8659 Personal history of other mental and behavioral disorders: Secondary | ICD-10-CM | POA: Diagnosis not present

## 2014-10-08 DIAGNOSIS — S161XXA Strain of muscle, fascia and tendon at neck level, initial encounter: Secondary | ICD-10-CM | POA: Insufficient documentation

## 2014-10-08 DIAGNOSIS — S199XXA Unspecified injury of neck, initial encounter: Secondary | ICD-10-CM | POA: Diagnosis present

## 2014-10-08 DIAGNOSIS — Y998 Other external cause status: Secondary | ICD-10-CM | POA: Diagnosis not present

## 2014-10-08 DIAGNOSIS — R52 Pain, unspecified: Secondary | ICD-10-CM

## 2014-10-08 DIAGNOSIS — E669 Obesity, unspecified: Secondary | ICD-10-CM | POA: Insufficient documentation

## 2014-10-08 MED ORDER — METHOCARBAMOL 500 MG PO TABS: 500.0000 mg | ORAL_TABLET | Freq: Two times a day (BID) | ORAL | Status: DC

## 2014-10-08 MED ORDER — NAPROXEN 500 MG PO TABS
500.0000 mg | ORAL_TABLET | Freq: Two times a day (BID) | ORAL | Status: DC
Start: 2014-10-08 — End: 2022-02-20

## 2014-10-08 MED ORDER — NAPROXEN 500 MG PO TABS
500.0000 mg | ORAL_TABLET | Freq: Once | ORAL | Status: AC
  Administered 2014-10-08: 500 mg via ORAL
  Filled 2014-10-08: qty 1
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
## 2014-10-08 NOTE — Discharge Instructions (Signed)
Alternate ice and heat to areas of injury. Take naproxen and Robaxin as prescribed for symptoms. Follow-up with your primary care doctor for a recheck of symptoms in one week. Return to the emergency department a few are unable to walk, lose sensation in your extremities, have any loss of your bowel or bladder function, develop a fever, or any of the symptoms listed below.  Muscle Strain A muscle strain is an injury that occurs when a muscle is stretched beyond its normal length. Usually a small number of muscle fibers are torn when this happens. Muscle strain is rated in degrees. First-degree strains have the least amount of muscle fiber tearing and pain. Second-degree and third-degree strains have increasingly more tearing and pain.  Usually, recovery from muscle strain takes 1-2 weeks. Complete healing takes 5-6 weeks.  CAUSES  Muscle strain happens when a sudden, violent force placed on a muscle stretches it too far. This may occur with lifting, sports, or a fall.  RISK FACTORS Muscle strain is especially common in athletes.  SIGNS AND SYMPTOMS At the site of the muscle strain, there may be:  Pain.  Bruising.  Swelling.  Difficulty using the muscle due to pain or lack of normal function. DIAGNOSIS  Your health care provider will perform a physical exam and ask about your medical history. TREATMENT  Often, the best treatment for a muscle strain is resting, icing, and applying cold compresses to the injured area.  HOME CARE INSTRUCTIONS   Use the PRICE method of treatment to promote muscle healing during the first 2-3 days after your injury. The PRICE method involves:  Protecting the muscle from being injured again.  Restricting your activity and resting the injured body part.  Icing your injury. To do this, put ice in a plastic bag. Place a towel between your skin and the bag. Then, apply the ice and leave it on from 15-20 minutes each hour. After the third day, switch to moist  heat packs.  Apply compression to the injured area with a splint or elastic bandage. Be careful not to wrap it too tightly. This may interfere with blood circulation or increase swelling.  Elevate the injured body part above the level of your heart as often as you can.  Only take over-the-counter or prescription medicines for pain, discomfort, or fever as directed by your health care provider.  Warming up prior to exercise helps to prevent future muscle strains. SEEK MEDICAL CARE IF:   You have increasing pain or swelling in the injured area.  You have numbness, tingling, or a significant loss of strength in the injured area. MAKE SURE YOU:   Understand these instructions.  Will watch your condition.  Will get help right away if you are not doing well or get worse. Document Released: 03/08/2005 Document Revised: 12/27/2012 Document Reviewed: 10/05/2012 [facility] Patient Information 2015 [place], [place]. This information is not intended to replace advice given to you by your health care provider. Make sure you discuss any questions you have with your health care provider.  Motor Vehicle Collision It is common to have multiple bruises and sore muscles after a motor vehicle collision (MVC). These tend to feel worse for the first 24 hours. You may have the most stiffness and soreness over the first several hours. You may also feel worse when you wake up the first morning after your collision. After this point, you will usually begin to improve with each day. The speed of improvement often depends on the severity of  the collision, the number of injuries, and the location and nature of these injuries. HOME CARE INSTRUCTIONS  Put ice on the injured area.  Put ice in a plastic bag.  Place a towel between your skin and the bag.  Leave the ice on for 15-20 minutes, 3-4 times a day, or as directed by your health care provider.  Drink enough fluids to keep your urine clear or pale yellow. Do  not drink alcohol.  Take a warm shower or bath once or twice a day. This will increase blood flow to sore muscles.  You may return to activities as directed by your caregiver. Be careful when lifting, as this may aggravate neck or back pain.  Only take over-the-counter or prescription medicines for pain, discomfort, or fever as directed by your caregiver. Do not use aspirin. This may increase bruising and bleeding. SEEK IMMEDIATE MEDICAL CARE IF:  You have numbness, tingling, or weakness in the arms or legs.  You develop severe headaches not relieved with medicine.  You have severe neck pain, especially tenderness in the middle of the back of your neck.  You have changes in bowel or bladder control.  There is increasing pain in any area of the body.  You have shortness of breath, light-headedness, dizziness, or fainting.  You have chest pain.  You feel sick to your stomach (nauseous), throw up (vomit), or sweat.  You have increasing abdominal discomfort.  There is blood in your urine, stool, or vomit.  You have pain in your shoulder (shoulder strap areas).  You feel your symptoms are getting worse. MAKE SURE YOU:  Understand these instructions.  Will watch your condition.  Will get help right away if you are not doing well or get worse. Document Released: 03/08/2005 Document Revised: 07/23/2013 Document Reviewed: 08/05/2010 [facility] Patient Information 2015 [place], [place]. This information is not intended to replace advice given to you by your health care provider. Make sure you discuss any questions you have with your health care provider.
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
## 2015-04-20 ENCOUNTER — Encounter (HOSPITAL_COMMUNITY): Payer: Self-pay | Admitting: *Deleted

## 2015-04-20 ENCOUNTER — Emergency Department (HOSPITAL_COMMUNITY)
Admission: EM | Admit: 2015-04-20 | Discharge: 2015-04-20 | Disposition: A | Discharge status: Home / Self Care | Payer: Medicaid Other | Attending: Emergency Medicine | Admitting: Emergency Medicine

## 2015-04-20 DIAGNOSIS — F17210 Nicotine dependence, cigarettes, uncomplicated: Secondary | ICD-10-CM | POA: Insufficient documentation

## 2015-04-20 DIAGNOSIS — Z791 Long term (current) use of non-steroidal anti-inflammatories (NSAID): Secondary | ICD-10-CM | POA: Diagnosis not present

## 2015-04-20 DIAGNOSIS — E669 Obesity, unspecified: Secondary | ICD-10-CM | POA: Diagnosis not present

## 2015-04-20 DIAGNOSIS — Z792 Long term (current) use of antibiotics: Secondary | ICD-10-CM | POA: Diagnosis not present

## 2015-04-20 DIAGNOSIS — Z79899 Other long term (current) drug therapy: Secondary | ICD-10-CM | POA: Diagnosis not present

## 2015-04-20 DIAGNOSIS — K047 Periapical abscess without sinus: Secondary | ICD-10-CM | POA: Diagnosis not present

## 2015-04-20 DIAGNOSIS — K0889 Other specified disorders of teeth and supporting structures: Secondary | ICD-10-CM | POA: Diagnosis present

## 2015-04-20 DIAGNOSIS — Z22322 Carrier or suspected carrier of Methicillin resistant Staphylococcus aureus: Secondary | ICD-10-CM | POA: Insufficient documentation

## 2015-04-20 DIAGNOSIS — Z8659 Personal history of other mental and behavioral disorders: Secondary | ICD-10-CM | POA: Insufficient documentation

## 2015-04-20 MED ORDER — OXYCODONE-ACETAMINOPHEN 5-325 MG PO TABS
1.0000 | ORAL_TABLET | Freq: Once | ORAL | Status: AC
  Administered 2015-04-20: 1 via ORAL

## 2015-04-20 MED ORDER — CLINDAMYCIN HCL 150 MG PO CAPS: 450.0000 mg | ORAL_CAPSULE | Freq: Three times a day (TID) | ORAL | Status: DC

## 2015-04-20 MED ORDER — CLINDAMYCIN HCL 150 MG PO CAPS
450.0000 mg | ORAL_CAPSULE | Freq: Once | ORAL | Status: AC
  Administered 2015-04-20: 450 mg via ORAL
  Filled 2015-04-20: qty 3

## 2015-04-20 MED ORDER — TRAMADOL HCL 50 MG PO TABS: 50.0000 mg | ORAL_TABLET | Freq: Four times a day (QID) | ORAL | Status: DC | PRN

## 2015-04-20 MED ORDER — OXYCODONE-ACETAMINOPHEN 5-325 MG PO TABS
ORAL_TABLET | ORAL | Status: AC
  Filled 2015-04-20: qty 1

## 2015-04-20 NOTE — Discharge Instructions (Signed)
You have been seen today for a dental abscess. Your imaging and lab tests showed no abnormalities. You must be treated by a dentist as soon as possible. Failing to treat this issue will only cause it to give you more pain and now possible widespread infection. Follow up with PCP as needed. Return to ED should symptoms worsen. Please take all of your antibiotics until finished!   You may develop abdominal discomfort or diarrhea from the antibiotic.  You may help offset this with probiotics which you can buy or get in yogurt. Do not eat or take the probiotics until 2 hours after your antibiotic.    Emergency Department Resource Guide 1) Find a Doctor and Pay Out of Pocket Although you won't have to find out who is covered by your insurance plan, it is a good idea to ask around and get recommendations. You will then need to call the office and see if the doctor you have chosen will accept you as a new patient and what types of options they offer for patients who are self-pay. Some doctors offer discounts or will set up payment plans for their patients who do not have insurance, but you will need to ask so you aren't surprised when you get to your appointment.  2) Contact Your Local Health Department Not all health departments have doctors that can see patients for sick visits, but many do, so it is worth a call to see if yours does. If you don't know where your local health department is, you can check in your phone book. The CDC also has a tool to help you locate your state's health department, and many state websites also have listings of all of their local health departments.  3) Find a Walk-in Clinic If your illness is not likely to be very severe or complicated, you may want to try a walk in clinic. These are popping up all over the country in pharmacies, drugstores, and shopping centers. They're usually staffed by nurse practitioners or physician assistants that have been trained to treat common  illnesses and complaints. They're usually fairly quick and inexpensive. However, if you have serious medical issues or chronic medical problems, these are probably not your best option.  No Primary Care Doctor: - Call Health Connect at  [phone] - they can help you locate a primary care doctor that  accepts your insurance, provides certain services, etc. - Physician Referral Service- [phone]  Chronic Pain Problems: Organization         Address  Phone   Notes  [facility]  [phone] Patients need to be referred by their primary care doctor.   Medication Assistance: Organization         Address  Phone   Notes  [facility] Medication [facility] [address] [place] [place], [place] [address] [phone] --Must be a resident of [facility] -- Must have NO insurance coverage whatsoever (no Medicaid/ Medicare, etc.) -- The pt. MUST have a primary care doctor that directs their care regularly and follows them in the community   [facility]ist  [phone]   [employer]  [phone]    Agencies that provide inexpensive medical care: Organization         Address  Phone   Notes  [facility]  [phone]   [facility]    [phone]   [facility] [address] [place], [place] [address] [phone]  [phone]   [facility] [place] [address] [place] [phone]   Planned Parenthood    [phone]   [facility]    [phone]   Community Health and [facility]  [address], [place] Phone:  [phone], Fax:  [phone] Hours of Operation:  9 am - 6 pm, M-F.  Also accepts Medicaid/Medicare and self-pay.  [facility]  [address], [place] Phone: [phone], Fax: [phone]. Hours of Operation:  8:30 am - 5:30 pm, M-F.  Also accepts Medicaid and self-pay.  [facility] [place]  [address] [place] Phone: [phone]   [facility] [address] Natasha Bence [place], [place] [phone], Ext. [phone] Mondays & Thursdays: 7-9 AM.  First 15 patients are seen on a first come, first serve basis.    Medicaid-accepting [facility] Providers:  Organization         Address  Phone   Notes  [facility] [address],  [phone] Also accepts self-pay patients.  [facility] [address] Laurell Josephs [place], [place]  [phone]   [facility] [address], [place] [phone]   [facility] [address] [place] [phone]   Renaye Rakers [address], [place]   [phone] Only accepts [place] Access [place] patients after they have their name applied to their card.   Self-Pay (no insurance) in [facility]:  Organization         Address  Phone   Notes  Sickle Cell Patients, [facility] [address] [place], [place] [phone]   [facility] [address] [place], [place] [phone]   [facility]  [address], [place] [phone]   [facility]/Dr. Osei-Bonsu  [address], [place] or [address], [place] [phone] Phone number for both [place] and [place] locations is the same.  Urgent Medical and [facility] [address] [place] [phone]   [facility] [address], [place] or [address] [phone]   [facility] [address], [place] [phone], phone; [phone], fax Sees patients 1st and 3rd Saturday of every month.  Must not qualify for public or private insurance (i.e. Medicaid, Medicare, [place] Health Choice, Veterans' Benefits)  Household income should be no more than 200% of the  poverty level The clinic cannot treat you if you are pregnant or think you are pregnant  Sexually transmitted diseases are not treated at the clinic.    Dental Care: Organization         Address  Phone  Notes  [facility] Department of [facility] [address] [place], [place] [phone] Accepts children up to age 42 who are enrolled in [place] or [place] Health Choice; pregnant women with a Medicaid card; and children who have applied for Medicaid or [place] Health Choice, but were declined, whose parents can pay a reduced fee at time of service.  [facility] Department of [facility]  [address], [place] [phone] Accepts children up to age 71 who are enrolled in [place] or  Health Choice; pregnant women with a  Medicaid card; and children who have applied for Medicaid or [place] Health Choice, but were declined, whose parents can pay a reduced fee at time of service.  [place] [facility]  [place] [phone] Patients are seen by appointment only. Walk-ins are not accepted. [place] will see patients 15 years of age and older. Monday - Tuesday (8am-5pm) Most Wednesdays (8:30-5pm) $30 per visit, cash only  [facility] Adult Dental Access PROGRAM  [address], [facility] [phone] Patients are seen by appointment only. Walk-ins are not accepted. [place] will see patients 78 years of age and older. One Wednesday Evening (Monthly: Volunteer Based).  $30 per visit, cash only  [place]  [phone] for adults; Children under age 24, call Graduate Pediatric Dentistry at [phone]. Children aged 31-14, please call [phone] to request a pediatric application.  Dental services are provided in all areas of dental care including fillings, crowns and bridges, complete and partial dentures, implants, gum treatment, root canals, and extractions.  Preventive care is also provided. Treatment is provided to both adults and children. Patients are selected via a lottery and there is often a waiting list.   [facility] [address] [place]  [phone] [email or web address]   [facility] [address] [place], [place] [phone], Ext. [phone] Second and Fourth Thursday of each month, opens at 6:30 AM; Clinic ends at 9 AM.  Patients are seen on a first-come first-served basis, and a limited number are seen during each clinic.   [facility]  [address] Hillard Danker [place], [place] [phone]   Eligibility Requirements You must have lived in [place], [place], or [place] counties for at least the last three months.   You cannot be eligible for state or federal sponsored [employer], including [employer], [place], or [employer].   You generally cannot be eligible for healthcare insurance through your employer.    How to apply: Eligibility screenings are held every Tuesday and Wednesday afternoon from 1:00 pm until 4:00 pm. You do not need an appointment for the interview!  [facility] [address], [place], [place]   [place]  [place] Department  [place]  [phone]    Behavioral Health Resources in the Community: Intensive Outpatient Programs Organization         Address  Phone  Notes  [place]. [address] [place], [place] [phone]   [facility] [address] [place], [place]   ADS: [facility] Drug S[facility]cs [address] [place], [place]   [place] [address],  [place], [place] or [phone]   Substance Abuse Resources Organization         Address  Phone  Notes  Alcohol and Drug Services  [phone]   [place]  [phone]   The [place]   Chinita Pester  [phone]   Residential & Outpatient Substance Abuse Program  [phone]   Psychological Services Organization         Address  Phone  Notes  [facility]  [place]  [phone]   [place] [address] [place] or [phone]    Mobile Crisis Teams Organization  Address  Phone  Notes  [facility], Mobile Crisis Care Unit  [phone]   [facility]  [address] [place], [place]   [facility] [address] [place] [phone]    Self-Help/Support Groups Organization         Address  Phone             Notes  [place]. of [place] - variety of support groups  [place] Call for more information  Narcotics Anonymous (NA), [facility] [address], [employer] [place]  2 meetings at this location   Special educational needs teacher         Address  Phone  Notes  [facility]idential [facility] [place],    [place]  [phone]   [facility]  [address] [place] [address]842, [place], [place]   [facility] [place], [place] [phone] Admissions: 8am-3pm M-F  Incentives Substance [place] [address]    [place], [place] [phone]   [facility] [address] [place], [place], [place]   [facility] [address]  [place], [place]   [facility]ight Programs - Intensive Outpatient [address] Kristeen Mans [address], [place], [place]   [facility] ([place] [place].,  [place], [place] [phone] or [phone]   [facility] (RTS) [address] [place], [place] Accepts Medicaid  [facility] [place] [address]  [place]  [phone] Substance Abuse/Addiction Treatment   [facility] Organization         Address  Phone  Notes  [facility]  [phone]   Domenic Schwab, PhD [address] Arlis Porta [place], [place]   [phone] or [phone]   [place]illo Hubbard [place], [place] [phone]   [facility] [address] [place], [place] [phone] Insurance/Medicaid/sponsorship through [facility] and Families [address]                                    [place], [place] [phone] [place] [address], [place] [phone]    Dr. Adele Schilder  [phone]   [facility] Dept. 1) [address], [place] 2) [place] 3)  [place] [address], [place] [phone]  [phone]   [place] [phone] or [phone] (After Hours)

## 2015-04-20 NOTE — ED Notes (Signed)
Declined W/C at D/C and was escorted to lobby by RN. 

## 2015-04-20 NOTE — ED Provider Notes (Signed)
CSN: [id number]     Arrival date & time 04/20/15  1616 History  By signing my name below, I, Freida Busman, attest that this documentation has been prepared under the direction and in the presence of non-physician practitioner, Harolyn Rutherford, PA-C. Electronically Signed: Freida Busman, Scribe. 04/20/2015. 5:18 PM.    Chief Complaint  Patient presents with  . Dental Pain   The history is provided by the patient. No language interpreter was used.    HPI Comments:  [name] is a 40 y.o. female who presents to the Emergency Department complaining of moderate left sided facial swelling and pain, that began last night but worsened this AM. She rates her pain a  7/10 pain and describes her pain as throbbing and achy, nonradiating. She denies fever, chills, nausea, vomiting, difficulty breathing or swallowing, or any other complaints. No alleviating factors noted.   Past Medical History  Diagnosis Date  . Obesity   . Heartburn   . Headache(784.0)     tx w/OTC meds prn  . Depression     no meds  . 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 MRSA (methicillin resistant staph aureus) culture positive    Past Surgical History  Procedure Laterality Date  . Wisdom tooth extraction    . Svd      x 4 -3 epidurals and 1 spinal w/ SVD  . Laparoscopic tubal ligation  06/11/2011    Procedure: LAPAROSCOPIC TUBAL LIGATION;  Surgeon: Antionette Char, MD;  Location: [facility];  Service: Gynecology;  Laterality: N/A;  . Laparoscopic appendectomy N/A 04/06/2013    Procedure: APPENDECTOMY LAPAROSCOPIC;  Surgeon: Clovis Pu. Cornett, MD;  Location: [facility] OR;  Service: General;  Laterality: N/A;  . Tubal ligation     Family History  Problem Relation Age of Onset  . Diabetes Mother   . Hyperlipidemia Mother   . Hypertension Mother   . Cancer Maternal Aunt     thyroid  . Cancer Maternal Grandmother     breast  . Diabetes Father    Social History  Substance Use Topics  . Smoking  status: Current Some Day Smoker -- 0.20 packs/day for 4 years    Types: Cigarettes  . Smokeless tobacco: Never Used     Comment: about 2 cigarettes per day  . Alcohol Use: Yes     Comment: occ   OB History    Gravida Para Term Preterm AB TAB SAB Ectopic Multiple Living   Review of Systems  Constitutional: Negative for fever and chills.  HENT: Positive for facial swelling. Negative for trouble swallowing.   Gastrointestinal: Negative for nausea and vomiting.    Allergies  Review of patient's allergies indicates no known allergies.  Home Medications   Prior to Admission medications   Medication Sig Start Date End Date Taking? Authorizing Provider  acetaminophen (TYLENOL) 500 MG tablet Take 1,000 mg by mouth every 6 (six) hours as needed for moderate pain.    Historical Provider, MD  Chlorhexidine Gluconate 2 % SOLN Wash with the solution once a week for 1 month. 06/04/14   Charm Rings, MD  clindamycin (CLEOCIN) 150 MG capsule Take 3 capsules (450 mg total) by mouth 3 (three) times daily. 04/20/15   Shawn C Joy, PA-C  cyclobenzaprine (FLEXERIL) 10 MG tablet Take 10 mg by mouth 2 (two) times daily.    Historical Provider, MD  doxycycline (VIBRAMYCIN) 100 MG capsule Take 1 capsule (100 mg total) by mouth 2 (two) times daily. 07/21/14   Cathren Laine, MD  ibuprofen (ADVIL,MOTRIN) 200 MG tablet Take 400 mg by mouth every 6 (six) hours as needed (pain).    Historical Provider, MD  methocarbamol (ROBAXIN) 500 MG tablet Take 1 tablet (500 mg total) by mouth 2 (two) times daily. 10/08/14   Antony Madura, PA-C  mupirocin nasal ointment (BACTROBAN) 2 % Place 1 application into the nose 2 (two) times daily. Use one-half of tube in each nostril twice daily for five (5) days. After application, press sides of nose together and gently massage.    Historical Provider, MD  naproxen (NAPROSYN) 500 MG tablet Take 1 tablet (500 mg total) by mouth 2 (two) times daily. 10/08/14   Antony Madura, PA-C   oxyCODONE-acetaminophen (PERCOCET/ROXICET) 5-325 MG per tablet Take 1-2 tablets by mouth every 4 (four) hours as needed for severe pain (pain).    Historical Provider, MD  sulfamethoxazole-trimethoprim (SEPTRA DS) 800-160 MG per tablet Take 1 tablet by mouth 2 (two) times daily. 06/04/14   Charm Rings, MD  traMADol (ULTRAM) 50 MG tablet Take 1 tablet (50 mg total) by mouth every 6 (six) hours as needed. 04/20/15   Shawn C Joy, PA-C   BP 126/68 mmHg  Pulse 85  Temp(Src) 98 F (36.7 C) (Oral)  Resp 18  SpO2 100%  LMP 03/20/2015 Physical Exam  Constitutional: She is oriented to person, place, and time. She appears well-developed and well-nourished. No distress.  HENT:  Head: Normocephalic and atraumatic.  Mouth/Throat: Uvula is midline. No posterior oropharyngeal edema.  Left lower buccal mucosal edema with large area of fluctuance. No exudate. Vesicles noted on the mucosal surface between the gingival and pupil mucosa on the lower left side. No signs of peritonsillar abscess. Tenderness and swelling noted to left lower jaw ~ the size of golf ball. Dentition intact; no obvious dental caries. Can open mouth to 3 fingers width. No difficulty swallowing oral secretions.   Eyes: Conjunctivae are normal.  Neck: Normal range of motion. Neck supple. No tracheal deviation present.  No cervical lymphadenopathy  Cardiovascular: Normal rate.   Pulmonary/Chest: Effort normal. No stridor.  Lymphadenopathy:    She has no cervical adenopathy.  Neurological: She is alert and oriented to person, place, and time.  Skin: Skin is warm and dry.  Psychiatric: She has a normal mood and affect.  Nursing note and vitals reviewed.   ED Course  Procedures   DIAGNOSTIC STUDIES:  Oxygen Saturation is 100% on RA, normal by my interpretation.    COORDINATION OF CARE:  4:50 PM Discussed treatment plan with pt at bedside and pt agreed to plan.  5:28 PM Pt evaluated by Dr. Fredderick Phenix at bedside.       MDM    Final diagnoses:  Dental abscess    [name] presents with left lower jaw swelling since last night.  Findings and plan of care discussed with Rolan Bucco, MD. Dr. Fredderick Phenix came to evaluate this patient personally.  Patient with abscess.  Abscess does not warrant drainage at this time and needs expert treatment by a dentist.  Supportive care and return precautions discussed.  Pt sent home with clindamycin and tramadol. Patient has no systemic symptoms, can readily swallow and talk, and has no red flag symptoms. Imaging not warranted at this time, however, should the patient have to return due to worsening symptoms a CT may be warranted at that time.  The patient appears reasonably screened and/or stabilized for discharge and I doubt any other emergent medical condition requiring further screening, evaluation, or treatment in the ED prior to discharge.   I personally performed the services described in this documentation, which was scribed in my presence. The recorded information has been reviewed and is accurate.    Anselm Pancoast, PA-C 04/20/15 1919  Rolan Bucco, MD 04/20/15 2329

## 2015-04-20 NOTE — ED Notes (Signed)
Pt reports onset last night of left side dental pain, has moderate swelling to left side of face today. Denies fever. Airway intact at triage.

## 2015-05-16 ENCOUNTER — Encounter (HOSPITAL_COMMUNITY): Payer: Self-pay | Admitting: *Deleted

## 2015-05-16 ENCOUNTER — Emergency Department (HOSPITAL_COMMUNITY)
Admission: EM | Admit: 2015-05-16 | Discharge: 2015-05-16 | Disposition: A | Discharge status: Home / Self Care | Payer: Medicaid Other | Source: Home / Self Care | Attending: Internal Medicine | Admitting: Internal Medicine

## 2015-05-16 DIAGNOSIS — K529 Noninfective gastroenteritis and colitis, unspecified: Secondary | ICD-10-CM | POA: Diagnosis not present

## 2015-05-16 MED ORDER — ONDANSETRON HCL 4 MG PO TABS: 8.0000 mg | ORAL_TABLET | ORAL | Status: DC | PRN

## 2015-05-16 NOTE — ED Provider Notes (Signed)
CSN: [id number]     Arrival date & time 05/16/15  1351 History   First MD Initiated Contact with Patient 05/16/15 1442     Chief Complaint  Patient presents with  . Nausea   HPI  Patient is a 40 year old lady who presents today with 4 episodes of vomiting this morning, and now persistent nausea and crampy lower abdominal discomfort. This is intermittent, not severe, predominantly in the left lower quadrant. She has not been hungry, but she is pushing fluids, drinking water and Sprite. She reports feeling tired and achy, and has some tactile temperature. Little bit of diarrhea day before yesterday. No dysuria, does have some frequency, but has been drinking a lot of fluids because of vomiting.  Past Medical History  Diagnosis Date  . Obesity   . Heartburn   . Headache(784.0)     tx w/OTC meds prn  . Depression     no meds  . 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 MRSA (methicillin resistant staph aureus) culture positive    Past Surgical History  Procedure Laterality Date  . Wisdom tooth extraction    . Svd      x 4 -3 epidurals and 1 spinal w/ SVD  . Laparoscopic tubal ligation  06/11/2011    Procedure: LAPAROSCOPIC TUBAL LIGATION;  Surgeon: Antionette Char, MD;  Location: [facility];  Service: Gynecology;  Laterality: N/A;  . Laparoscopic appendectomy N/A 04/06/2013    Procedure: APPENDECTOMY LAPAROSCOPIC;  Surgeon: Clovis Pu. Cornett, MD;  Location: [facility];  Service: General;  Laterality: N/A;  . Tubal ligation     Family History  Problem Relation Age of Onset  . Diabetes Mother   . Hyperlipidemia Mother   . Hypertension Mother   . Cancer Maternal Aunt     thyroid  . Cancer Maternal Grandmother     breast  . Diabetes Father    Social History  Substance Use Topics  . Smoking status: Current Some Day Smoker -- 0.20 packs/day for 4 years    Types: Cigarettes  . Smokeless tobacco: Never Used     Comment: about 2 cigarettes per day  . Alcohol  Use: Yes     Comment: occ   OB History    Gravida Para Term Preterm AB TAB SAB Ectopic Multiple Living   Review of Systems  All other systems reviewed and are negative.   Allergies  Review of patient's allergies indicates no known allergies.  Home Medications   Prior to Admission medications   Medication Sig Start Date End Date Taking? Authorizing Provider  acetaminophen (TYLENOL) 500 MG tablet Take 1,000 mg by mouth every 6 (six) hours as needed for moderate pain.    Historical Provider, MD  Chlorhexidine Gluconate 2 % SOLN Wash with the solution once a week for 1 month. 06/04/14   Charm Rings, MD  clindamycin (CLEOCIN) 150 MG capsule Take 3 capsules (450 mg total) by mouth 3 (three) times daily. 04/20/15   Shawn C Joy, PA-C  cyclobenzaprine (FLEXERIL) 10 MG tablet Take 10 mg by mouth 2 (two) times daily.    Historical Provider, MD  doxycycline (VIBRAMYCIN) 100 MG capsule Take 1 capsule (100 mg total) by mouth 2 (two) times daily. 07/21/14   Cathren Laine, MD  ibuprofen (ADVIL,MOTRIN) 200 MG tablet Take 400 mg by mouth every 6 (six) hours as needed (pain).  Historical Provider, MD  methocarbamol (ROBAXIN) 500 MG tablet Take 1 tablet (500 mg total) by mouth 2 (two) times daily. 10/08/14   Antony Madura, PA-C  mupirocin nasal ointment (BACTROBAN) 2 % Place 1 application into the nose 2 (two) times daily. Use one-half of tube in each nostril twice daily for five (5) days. After application, press sides of nose together and gently massage.    Historical Provider, MD  naproxen (NAPROSYN) 500 MG tablet Take 1 tablet (500 mg total) by mouth 2 (two) times daily. 10/08/14   Antony Madura, PA-C  ondansetron (ZOFRAN) 4 MG tablet Take 2 tablets (8 mg total) by mouth every 4 (four) hours as needed for nausea or vomiting. 05/16/15   Eustace Moore, MD  oxyCODONE-acetaminophen (PERCOCET/ROXICET) 5-325 MG per tablet Take 1-2 tablets by mouth every 4 (four) hours as needed for severe pain  (pain).    Historical Provider, MD  sulfamethoxazole-trimethoprim (SEPTRA DS) 800-160 MG per tablet Take 1 tablet by mouth 2 (two) times daily. 06/04/14   Charm Rings, MD  traMADol (ULTRAM) 50 MG tablet Take 1 tablet (50 mg total) by mouth every 6 (six) hours as needed. 04/20/15   Shawn C Joy, PA-C    BP 118/73 mmHg  Pulse 81  Temp(Src) 98.9 F (37.2 C) (Oral)  Resp 16  SpO2 100%  LMP 05/05/2014   Physical Exam  Constitutional: She is oriented to person, place, and time.  Alert, nicely groomed Sitting up on the end of the exam table Looks ill but not toxic  HENT:  Head: Atraumatic.  Eyes:  Conjugate gaze, no eye redness/drainage  Neck: Neck supple.  Cardiovascular: Normal rate and regular rhythm.   Pulmonary/Chest: No respiratory distress.  Abdominal: Soft. She exhibits no distension. There is no guarding.  Mild discomfort to deep palpation in the left lower quadrant  Musculoskeletal: Normal range of motion. She exhibits no edema.  No leg swelling  Neurological: She is alert and oriented to person, place, and time.  Skin: Skin is warm and dry.  No cyanosis  Nursing note and vitals reviewed.   ED Course  Procedures (including critical care time)  None  MDM   1. Gastroenteritis, acute    Push fluids, rest Note for work today and tomorrow Prescription for Zofran sent to the [facility] on [address]  or follow-up PCP/Dr. Quintella Reichert, for persistent symptoms    Eustace Moore, MD 05/16/15 1550

## 2015-05-16 NOTE — Discharge Instructions (Signed)
Push fluids, rest. Prescription for zofran (nausea) sent to [facility] on [address]. Note for work today/tomorrow. Recheck or followup with pcp/Dr Quintella Reichert if not improving in a few days.

## 2015-05-16 NOTE — ED Notes (Signed)
Pt    Reports  Symptoms    Of       Vomiting     This  Am      With  Nausea  And  Diarrhea        X  2  Days       Pt  Reports      Symptoms     Not  releived by otc  meds           Pt  Reports  Was  Better  Yesterday  But  Symptoms  Worse  Today    Was   On  Clindamycin  1  Week  Ago

## 2015-12-10 ENCOUNTER — Encounter (HOSPITAL_COMMUNITY): Payer: Self-pay | Admitting: Emergency Medicine

## 2015-12-10 ENCOUNTER — Ambulatory Visit (HOSPITAL_COMMUNITY)
Admission: EM | Admit: 2015-12-10 | Discharge: 2015-12-10 | Discharge status: Against Medical Advice | Payer: Medicaid Other | Attending: Family Medicine | Admitting: Family Medicine

## 2015-12-10 DIAGNOSIS — N390 Urinary tract infection, site not specified: Secondary | ICD-10-CM

## 2015-12-10 DIAGNOSIS — N898 Other specified noninflammatory disorders of vagina: Secondary | ICD-10-CM

## 2015-12-10 DIAGNOSIS — F17210 Nicotine dependence, cigarettes, uncomplicated: Secondary | ICD-10-CM | POA: Insufficient documentation

## 2015-12-10 DIAGNOSIS — Z79899 Other long term (current) drug therapy: Secondary | ICD-10-CM | POA: Insufficient documentation

## 2015-12-10 MED ORDER — NITROFURANTOIN MONOHYD MACRO 100 MG PO CAPS: 100.0000 mg | ORAL_CAPSULE | Freq: Two times a day (BID) | ORAL | 0 refills | Status: AC

## 2015-12-10 NOTE — ED Triage Notes (Signed)
Pt has been suffering from a cough for several days.  She started taking Robitussin DM on Monday.  By Tuesday morning she was noticing symptoms of dizziness, nausea, sleeplessness, and numbness and tingling all over.  Her last dose of cough medicine was yesterday about 0800.  Pt also reports new onset right back pain around her kidney.  She states she has been having burning and itching with urination and polyuria.  She denies any fever.

## 2015-12-10 NOTE — ED Provider Notes (Signed)
CSN: [id number]     Arrival date & time 12/10/15  1041 History   First MD Initiated Contact with Patient 12/10/15 1250     Chief Complaint  Patient presents with  . medicatioin reaction  . Back Pain   (Consider location/radiation/quality/duration/timing/severity/associated sxs/prior Treatment) Patient is a well-appearing 40 y.o female, presents today for multiple complaints. Patient reports to have a cold started a few days ago. She bought some OTC cold medicine (Don't remember the exact name) from [employer] and took it on Monday afternoon. She started noticing nausea, dizzy, loss of appetite, restlessness, numbness and tingling sensation in her right lower leg and bilateral hands on Tuesday. Patient believed she got allergic to the cold medicine. She states that she read the information on the package and all of her symptoms are listed under the side effects. Patient reports that her symptoms are improving and has mostly resolved, however she is feeling a bit dizzy.   She also complaints of possible UTI with dysuria, urinary frequency, vaginal discharge, vaginal itchiness and right flank pain. She reports the duration of these symptoms to be 3 days. She reports her discharge to be white and thin. She denies odor in her discharge. She is sexually active with 1 partner.       Past Medical History:  Diagnosis Date  . ADD (attention deficit disorder) without hyperactivity    no meds  . Depression    no meds  . Headache(784.0)    tx w/OTC meds prn  . Heartburn   . MRSA (methicillin resistant staph aureus) culture positive   . Obesity    Past Surgical History:  Procedure Laterality Date  . LAPAROSCOPIC APPENDECTOMY N/A 04/06/2013   Procedure: APPENDECTOMY LAPAROSCOPIC;  Surgeon: Clovis Puhomas A. Cornett, MD;  Location: [facility] OR;  Service: General;  Laterality: N/A;  . LAPAROSCOPIC TUBAL LIGATION  06/11/2011   Procedure: LAPAROSCOPIC TUBAL LIGATION;  Surgeon: Antionette CharLisa [name], MD;  Location: [facility];  Service: Gynecology;  Laterality: N/A;  . SVD     x 4 -3 epidurals and 1 spinal w/ SVD  . TUBAL LIGATION    . WISDOM TOOTH EXTRACTION     Family History  Problem Relation Age of Onset  . Diabetes Mother   . Hyperlipidemia Mother   . Hypertension Mother   . Cancer Maternal Aunt     thyroid  . Cancer Maternal Grandmother     breast  . Diabetes Father    Social History  Substance Use Topics  . Smoking status: Current Some Day Smoker    Packs/day: 0.20    Years: 4.00    Types: Cigarettes  . Smokeless tobacco: Never Used     Comment: about 2 cigarettes per day  . Alcohol use Yes     Comment: occ   OB History    Gravida Para Term Preterm AB Living   4 4 4     4    SAB TAB Ectopic Multiple Live Births           1     Review of Systems  Constitutional: Negative for chills, fatigue and fever.       Appetite has returned  HENT: Positive for sneezing. Negative for congestion, ear pain and sore throat.   Respiratory: Positive for cough. Negative for shortness of breath.   Cardiovascular: Negative for chest pain, palpitations and leg swelling.  Gastrointestinal: Negative for abdominal pain, diarrhea, nausea and vomiting.  Genitourinary: Positive for dysuria, flank pain, frequency and urgency. Negative  for vaginal discharge.  Skin: Negative for rash.  Neurological: Positive for dizziness. Negative for weakness and headaches.       Slightly dizzy     Allergies  Review of patient's allergies indicates no known allergies.  Home Medications   Prior to Admission medications   Medication Sig Start Date End Date Taking? Authorizing Provider  acetaminophen (TYLENOL) 500 MG tablet Take 1,000 mg by mouth every 6 (six) hours as needed for moderate pain.    Historical Provider, MD  Chlorhexidine Gluconate 2 % SOLN Wash with the solution once a week for 1 month. 06/04/14   Charm Rings, MD  clindamycin (CLEOCIN) 150 MG capsule Take 3 capsules (450 mg total) by mouth 3 (three) times  daily. 04/20/15   Shawn C Joy, PA-C  cyclobenzaprine (FLEXERIL) 10 MG tablet Take 10 mg by mouth 2 (two) times daily.    Historical Provider, MD  doxycycline (VIBRAMYCIN) 100 MG capsule Take 1 capsule (100 mg total) by mouth 2 (two) times daily. 07/21/14   Cathren Laine, MD  ibuprofen (ADVIL,MOTRIN) 200 MG tablet Take 400 mg by mouth every 6 (six) hours as needed (pain).    Historical Provider, MD  methocarbamol (ROBAXIN) 500 MG tablet Take 1 tablet (500 mg total) by mouth 2 (two) times daily. 10/08/14   Antony Madura, PA-C  mupirocin nasal ointment (BACTROBAN) 2 % Place 1 application into the nose 2 (two) times daily. Use one-half of tube in each nostril twice daily for five (5) days. After application, press sides of nose together and gently massage.    Historical Provider, MD  naproxen (NAPROSYN) 500 MG tablet Take 1 tablet (500 mg total) by mouth 2 (two) times daily. 10/08/14   Antony Madura, PA-C  nitrofurantoin, macrocrystal-monohydrate, (MACROBID) 100 MG capsule Take 1 capsule (100 mg total) by mouth 2 (two) times daily. 12/10/15 12/15/15  Lucia Estelle, NP  ondansetron (ZOFRAN) 4 MG tablet Take 2 tablets (8 mg total) by mouth every 4 (four) hours as needed for nausea or vomiting. 05/16/15   Eustace Moore, MD  oxyCODONE-acetaminophen (PERCOCET/ROXICET) 5-325 MG per tablet Take 1-2 tablets by mouth every 4 (four) hours as needed for severe pain (pain).    Historical Provider, MD  sulfamethoxazole-trimethoprim (SEPTRA DS) 800-160 MG per tablet Take 1 tablet by mouth 2 (two) times daily. 06/04/14   Charm Rings, MD  traMADol (ULTRAM) 50 MG tablet Take 1 tablet (50 mg total) by mouth every 6 (six) hours as needed. 04/20/15   Anselm Pancoast, PA-C   Meds Ordered and Administered this Visit  Medications - No data to display  BP 110/78 (BP Location: Left Arm)   Pulse 83   Temp 98 F (36.7 C) (Oral)   LMP 11/09/2015 (Approximate)   SpO2 100%  No data found.   Physical Exam  Constitutional: She is oriented to  person, place, and time. She appears well-developed and well-nourished. No distress.  HENT:  Head: Normocephalic and atraumatic.  Right Ear: External ear normal.  Left Ear: External ear normal.  Nose: Nose normal.  Mouth/Throat: Oropharynx is clear and moist. No oropharyngeal exudate.  Eyes: Conjunctivae and EOM are normal. Pupils are equal, round, and reactive to light. Right eye exhibits no discharge. Left eye exhibits no discharge.  Neck: Normal range of motion. Neck supple.  Cardiovascular: Normal rate, regular rhythm, normal heart sounds and intact distal pulses.   No murmur heard. Pulmonary/Chest: Effort normal and breath sounds normal. No respiratory distress.  Abdominal: Soft. Bowel  sounds are normal. She exhibits no distension. There is no tenderness.  Genitourinary:  Genitourinary Comments: Mild Right CVA tenderness reported Unable to perform pelvic exam  Musculoskeletal: Normal range of motion.  Lymphadenopathy:    She has no cervical adenopathy.  Neurological: She is alert and oriented to person, place, and time.  Negative romberg, good coordination, cranial nerves intact  Skin: Skin is warm and dry. She is not diaphoretic.  Psychiatric: She has a normal mood and affect.  Nursing note and vitals reviewed.   Urgent Care Course   Clinical Course    Procedures (including critical care time)  Labs Review Labs Reviewed  WET PREP, GENITAL  URINE CULTURE  URINE CYTOLOGY ANCILLARY ONLY    Imaging Review No results found.    1. UTI (lower urinary tract infection)   2. Vaginal discharge      13:12: Patient came out of the nursing station shortly after I have seem her, reporting that she needs to leave immediately because somebody is waiting for her out in the care, and she cannot stay any longer, not even a minute longer.  Patient is leaving against AMA. Her UA was +for leukocytes and +for Nitrite, will treat for UTI with Macrobid 100mg  BID x 5 days. Urine culture  pending. Unable to do pelvic exam, endocervical cytology and wet prep cancelled, ordered changed to urine cytology. Patient did informed to return if she does not improve, did informed to go to ER if her nausea, numbness/tingling sensation return or if her current dizziness worsen.     Lucia Estelle, NP 12/10/15 1344

## 2015-12-11 LAB — URINE CYTOLOGY ANCILLARY ONLY
CHLAMYDIA, DNA PROBE: NEGATIVE
Neisseria Gonorrhea: NEGATIVE

## 2015-12-13 LAB — URINE CULTURE: Special Requests: NORMAL

## 2018-06-25 ENCOUNTER — Other Ambulatory Visit: Payer: Self-pay

## 2018-06-25 ENCOUNTER — Encounter (HOSPITAL_COMMUNITY): Payer: Self-pay

## 2018-06-25 ENCOUNTER — Emergency Department (HOSPITAL_COMMUNITY): Payer: Medicaid Other

## 2018-06-25 ENCOUNTER — Emergency Department (HOSPITAL_COMMUNITY)
Admission: EM | Admit: 2018-06-25 | Discharge: 2018-06-25 | Disposition: A | Discharge status: Home / Self Care | Payer: Medicaid Other | Attending: Emergency Medicine | Admitting: Emergency Medicine

## 2018-06-25 DIAGNOSIS — F17210 Nicotine dependence, cigarettes, uncomplicated: Secondary | ICD-10-CM | POA: Insufficient documentation

## 2018-06-25 DIAGNOSIS — Y998 Other external cause status: Secondary | ICD-10-CM | POA: Insufficient documentation

## 2018-06-25 DIAGNOSIS — R0789 Other chest pain: Secondary | ICD-10-CM | POA: Insufficient documentation

## 2018-06-25 DIAGNOSIS — Y92410 Unspecified street and highway as the place of occurrence of the external cause: Secondary | ICD-10-CM | POA: Insufficient documentation

## 2018-06-25 DIAGNOSIS — Y939 Activity, unspecified: Secondary | ICD-10-CM | POA: Insufficient documentation

## 2018-06-25 DIAGNOSIS — S93601A Unspecified sprain of right foot, initial encounter: Secondary | ICD-10-CM | POA: Insufficient documentation

## 2018-06-25 DIAGNOSIS — Z79899 Other long term (current) drug therapy: Secondary | ICD-10-CM | POA: Insufficient documentation

## 2018-06-25 MED ORDER — IBUPROFEN 400 MG PO TABS
600.0000 mg | ORAL_TABLET | Freq: Once | ORAL | Status: AC
  Administered 2018-06-25: 600 mg via ORAL
  Filled 2018-06-25: qty 1
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
## 2018-06-25 NOTE — Discharge Instructions (Signed)
Rest - please stay off foot as much as possible Ice - ice for 20 minutes at a time, several times a day Elevate - elevate foot above level of heart Ibuprofen - take with food. Take up to 3-4 times daily
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
## 2020-05-07 ENCOUNTER — Other Ambulatory Visit: Payer: Medicaid Other

## 2020-05-07 DIAGNOSIS — Z20822 Contact with and (suspected) exposure to covid-19: Secondary | ICD-10-CM

## 2020-05-08 LAB — SARS-COV-2, NAA 2 DAY TAT

## 2020-05-08 LAB — NOVEL CORONAVIRUS, NAA: SARS-CoV-2, NAA: DETECTED — AB

## 2020-05-09 ENCOUNTER — Telehealth: Payer: Self-pay | Admitting: Oncology

## 2020-05-09 ENCOUNTER — Encounter: Payer: Self-pay | Admitting: Oncology

## 2020-05-09 NOTE — Telephone Encounter (Signed)
Called to discuss with patient about COVID-19 symptoms and the use of one of the available treatments for those with mild to moderate Covid symptoms and at a high risk of hospitalization.  Pt appears to qualify for outpatient treatment due to co-morbid conditions and/or a member of an at-risk group in accordance with the FDA Emergency Use Authorization.    Symptom onset: 05/08/20 Vaccinated:Yes Booster? yes Immunocompromised? No Qualifiers:   Past Medical History:  Diagnosis Date  . ADD (attention deficit disorder) without hyperactivity    no meds  . Depression    no meds  . Headache(784.0)    tx w/OTC meds prn  . Heartburn   . MRSA (methicillin resistant staph aureus) culture positive   . Obesity    Spoke with patient who would like to see additional information and get back to [place].   Email sent to:[email or web address]  Mauro Kaufmann
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
## 2020-12-15 ENCOUNTER — Emergency Department (HOSPITAL_COMMUNITY): Payer: Self-pay

## 2020-12-15 ENCOUNTER — Emergency Department (HOSPITAL_COMMUNITY)
Admission: EM | Admit: 2020-12-15 | Discharge: 2020-12-15 | Disposition: A | Discharge status: Home / Self Care | Payer: Self-pay | Attending: Emergency Medicine | Admitting: Emergency Medicine

## 2020-12-15 ENCOUNTER — Other Ambulatory Visit: Payer: Self-pay

## 2020-12-15 DIAGNOSIS — Z20822 Contact with and (suspected) exposure to covid-19: Secondary | ICD-10-CM | POA: Insufficient documentation

## 2020-12-15 DIAGNOSIS — F17210 Nicotine dependence, cigarettes, uncomplicated: Secondary | ICD-10-CM | POA: Insufficient documentation

## 2020-12-15 DIAGNOSIS — R0602 Shortness of breath: Secondary | ICD-10-CM | POA: Insufficient documentation

## 2020-12-15 DIAGNOSIS — R079 Chest pain, unspecified: Secondary | ICD-10-CM | POA: Insufficient documentation

## 2020-12-15 LAB — I-STAT BETA HCG BLOOD, ED (MC, WL, AP ONLY): I-stat hCG, quantitative: <5 m[IU]/mL (ref <5)

## 2020-12-15 LAB — BASIC METABOLIC PANEL
Anion gap: 7 (ref 5–15)
BUN: 7 mg/dL (ref 6–20)
CO2: 24 mmol/L (ref 22–32)
Calcium: 8.9 mg/dL (ref 8.9–10.3)
Chloride: 106 mmol/L (ref 98–111)
Creatinine, Ser: 0.96 mg/dL (ref 0.44–1.00)
GFR, Estimated: >60 mL/min (ref >60)
Glucose, Bld: 99 mg/dL (ref 70–99)
Potassium: 3.5 mmol/L (ref 3.5–5.1)
Sodium: 137 mmol/L (ref 135–145)

## 2020-12-15 LAB — CBC
HCT: 39.7 % (ref 36.0–46.0)
Hemoglobin: 13.3 g/dL (ref 12.0–15.0)
MCH: 30.2 pg (ref 26.0–34.0)
MCHC: 33.5 g/dL (ref 30.0–36.0)
MCV: 90.2 fL (ref 80.0–100.0)
Platelets: 247 10*3/uL (ref 150–400)
RBC: 4.4 MIL/uL (ref 3.87–5.11)
RDW: 12.4 % (ref 11.5–15.5)
WBC: 7.6 10*3/uL (ref 4.0–10.5)
nRBC: 0 % (ref 0.0–0.2)

## 2020-12-15 LAB — SARS CORONAVIRUS 2 (TAT 6-24 HRS): SARS Coronavirus 2: NEGATIVE

## 2020-12-15 LAB — TROPONIN I (HIGH SENSITIVITY)
Troponin I (High Sensitivity): 3 ng/L (ref <18)
Troponin I (High Sensitivity): <2 ng/L (ref <18)

## 2020-12-15 MED ORDER — OXYCODONE-ACETAMINOPHEN 5-325 MG PO TABS
1.0000 | ORAL_TABLET | Freq: Once | ORAL | Status: AC
  Administered 2020-12-15: 1 via ORAL
  Filled 2020-12-15: qty 1

## 2020-12-15 NOTE — ED Triage Notes (Signed)
Pt woke up with L sided chest pain with dizziness, nausea, diarrhea, shob, and L arm tingling and numbness. Pain worse with movement.

## 2020-12-15 NOTE — ED Provider Notes (Signed)
Emergency Medicine Provider Triage Evaluation Note  [name] , a 45 y.o. female  was evaluated in triage.  Pt complains of left sided chest pain that began earlier this AM with associated dizziness, nausea, and SOB. Reports she also had 1 episode of diarrhea this AM. No vomiting or abdominal pain. Active smoker. No known Fhx of CAD.  Review of Systems  Positive: + cp, SOB, nausea, dizziness, diarrhea Negative: - vomiting  Physical Exam  There were no vitals taken for this visit. Gen:   Awake, no distress   Resp:  Normal effort  MSK:   Moves extremities without difficulty  Other:  RRR  Medical Decision Making  Medically screening exam initiated at 7:23 AM.  Appropriate orders placed.  [name] was informed that the remainder of the evaluation will be completed by another provider, this initial triage assessment does not replace that evaluation, and the importance of remaining in the ED until their evaluation is complete.     Sandra Rockers, PA-C 12/15/20 0723    Sandra Logan, DO 12/15/20 [phone]

## 2020-12-15 NOTE — Discharge Instructions (Signed)
Follow-up with your primary care doctor to discuss symptoms you are experiencing today.  Recommend taking anti-inflammatory such as naproxen or Motrin as needed for pain.  If you develop worsening pain, any difficulty in breathing or other concerning symptom, come back to ER for reassessment.

## 2020-12-15 NOTE — ED Notes (Signed)
Pt stepped outside.  

## 2020-12-15 NOTE — ED Notes (Signed)
Pt family member came and asked if the pt can have something to eat and drink. Expressed to the pt and family member that we do not suggest eating or drinking until a doctor says that it is okay due to not knowing what is going on yet, scans and blood work. Family member said that it will be worse if the pt does not eat. Family member and pt proceeded to get items from the vending machine.

## 2020-12-15 NOTE — ED Notes (Signed)
Pt d/c home per MD order. Discharge summary reviewed with pt, pt verbalizes understanding. No s/s of acute distress noted at discharge. Ambulatory off unit. Reports visitor is discharge ride home.  

## 2020-12-15 NOTE — ED Provider Notes (Signed)
[facility] EMERGENCY DEPARTMENT Provider Note   CSN: [id number] Arrival date & time: 12/15/20  0938     History Chief Complaint  Patient presents with   Chest Pain    [name] is a 45 y.o. female.  Presented to the emergency department with concern for chest pain.  Pain first noted this morning when she woke up around 6:15 AM.  Moderate to severe, left-sided, nonradiating.  Aching pain.  Worse with certain movements and positions.  Not associated with exertion.  Occurring at rest.  No difficulty breathing.  No cough or congestion.  Did have 1 episode of loose stool.  No blood.  No dysuria or hematuria.  Took an aspirin prior to arrival and thinks this may have helped some.   Smoker, denies family history of CAD.    HPI     Past Medical History:  Diagnosis Date   ADD (attention deficit disorder) without hyperactivity    no meds   Depression    no meds   Headache(784.0)    tx w/OTC meds prn   Heartburn    MRSA (methicillin resistant staph aureus) culture positive    Obesity     Patient Active Problem List   Diagnosis Date Noted   Appendicitis 04/06/2013   Sterilization 06/11/2011   Oligohydramnios 04/10/2011    Past Surgical History:  Procedure Laterality Date   LAPAROSCOPIC APPENDECTOMY N/A 04/06/2013   Procedure: APPENDECTOMY LAPAROSCOPIC;  Surgeon: Clovis Pu. Cornett, MD;  Location: [facility] OR;  Service: General;  Laterality: N/A;   LAPAROSCOPIC TUBAL LIGATION  06/11/2011   Procedure: LAPAROSCOPIC TUBAL LIGATION;  Surgeon: Antionette Char, MD;  Location: [facility] ORS;  Service: Gynecology;  Laterality: N/A;   SVD     x 4 -3 epidurals and 1 spinal w/ SVD   TUBAL LIGATION     WISDOM TOOTH EXTRACTION       OB History     Gravida  4   Para  4   Term  4   Preterm      AB      Living  4      SAB      IAB      Ectopic      Multiple      Live Births  1           Family History  Problem Relation Age of Onset   Diabetes  Mother    Hyperlipidemia Mother    Hypertension Mother    Cancer Maternal Aunt        thyroid   Cancer Maternal Grandmother        breast   Diabetes Father     Social History   Tobacco Use   Smoking status: Some Days    Packs/day: 0.20    Years: 4.00    Pack years: 0.80    Types: Cigarettes   Smokeless tobacco: Never   Tobacco comments:    about 2 cigarettes per day  Substance Use Topics   Alcohol use: Yes    Comment: occ   Drug use: No    Home Medications Prior to Admission medications   Medication Sig Start Date End Date Taking? Authorizing Provider  acetaminophen (TYLENOL) 500 MG tablet Take 1,000 mg by mouth every 6 (six) hours as needed for moderate pain.    [provider]  Chlorhexidine Gluconate 2 % SOLN Wash with the solution once a week for 1 month. 06/04/14   Charm Rings, MD  clindamycin (CLEOCIN) 150 MG capsule Take 3 capsules (450 mg total) by mouth 3 (three) times daily. 04/20/15   Joy, Shawn C, PA-C  cyclobenzaprine (FLEXERIL) 10 MG tablet Take 10 mg by mouth 2 (two) times daily.    [provider]  doxycycline (VIBRAMYCIN) 100 MG capsule Take 1 capsule (100 mg total) by mouth 2 (two) times daily. 07/21/14   Cathren Laine, MD  ibuprofen (ADVIL,MOTRIN) 200 MG tablet Take 400 mg by mouth every 6 (six) hours as needed (pain).    [provider]  methocarbamol (ROBAXIN) 500 MG tablet Take 1 tablet (500 mg total) by mouth 2 (two) times daily. 10/08/14   Antony Madura, PA-C  mupirocin nasal ointment (BACTROBAN) 2 % Place 1 application into the nose 2 (two) times daily. Use one-half of tube in each nostril twice daily for five (5) days. After application, press sides of nose together and gently massage.    [provider]  naproxen (NAPROSYN) 500 MG tablet Take 1 tablet (500 mg total) by mouth 2 (two) times daily. 10/08/14   Antony Madura, PA-C  ondansetron (ZOFRAN) 4 MG tablet Take 2 tablets (8 mg total) by mouth every 4 (four) hours as  needed for nausea or vomiting. 05/16/15   Isa Rankin, MD  oxyCODONE-acetaminophen (PERCOCET/ROXICET) 5-325 MG per tablet Take 1-2 tablets by mouth every 4 (four) hours as needed for severe pain (pain).    [provider]  sulfamethoxazole-trimethoprim (SEPTRA DS) 800-160 MG per tablet Take 1 tablet by mouth 2 (two) times daily. 06/04/14   Charm Rings, MD  traMADol (ULTRAM) 50 MG tablet Take 1 tablet (50 mg total) by mouth every 6 (six) hours as needed. 04/20/15   Joy, Hillard Danker, PA-C    Allergies    Patient has no known allergies.  Review of Systems   Review of Systems  Constitutional:  Negative for chills and fever.  HENT:  Negative for ear pain and sore throat.   Eyes:  Negative for pain and visual disturbance.  Respiratory:  Negative for cough and shortness of breath.   Cardiovascular:  Positive for chest pain. Negative for palpitations.  Gastrointestinal:  Negative for abdominal pain and vomiting.  Genitourinary:  Negative for dysuria and hematuria.  Musculoskeletal:  Negative for arthralgias and back pain.  Skin:  Negative for color change and rash.  Neurological:  Negative for seizures and syncope.  All other systems reviewed and are negative.  Physical Exam Updated Vital Signs BP 116/83   Pulse 81   Temp 98 F (36.7 C) (Oral)   Resp (!) 21   SpO2 99%   Physical Exam Vitals and nursing note reviewed.  Constitutional:      General: She is not in acute distress.    Appearance: She is well-developed.  HENT:     Head: Normocephalic and atraumatic.  Eyes:     Conjunctiva/sclera: Conjunctivae normal.  Cardiovascular:     Rate and Rhythm: Normal rate and regular rhythm.     Heart sounds: No murmur heard. Pulmonary:     Effort: Pulmonary effort is normal. No respiratory distress.     Breath sounds: Normal breath sounds.  Abdominal:     Palpations: Abdomen is soft.     Tenderness: There is no abdominal tenderness.  Musculoskeletal:     Cervical back:  Neck supple.  Skin:    General: Skin is warm and dry.  Neurological:     Mental Status: She is alert.    ED Results /  Procedures / Treatments   Labs (all labs ordered are listed, but only abnormal results are displayed) Labs Reviewed  SARS CORONAVIRUS 2 (TAT 6-24 HRS)  BASIC METABOLIC PANEL  CBC  I-STAT BETA HCG BLOOD, ED ([facility], [facility], [facility] ONLY)  TROPONIN I (HIGH SENSITIVITY)  TROPONIN I (HIGH SENSITIVITY)    EKG EKG Interpretation  Date/Time:  Monday December 15 2020 07:20:34 EDT Ventricular Rate:  86 PR Interval:  150 QRS Duration: 84 QT Interval:  384 QTC Calculation: 459 R Axis:   49 Text Interpretation: Sinus rhythm with occasional Premature ventricular complexes Otherwise normal ECG Confirmed by Marianna Fuss ([id number]) on 12/15/2020 2:06:20 PM  Radiology DG Chest 2 View  Result Date: 12/15/2020 CLINICAL DATA:  Chest pain and shortness of breath. EXAM: CHEST - 2 VIEW COMPARISON:  Two-view chest x-ray 06/25/2018 FINDINGS: The heart size and mediastinal contours are within normal limits. Both lungs are clear. The visualized skeletal structures are unremarkable. IMPRESSION: No active cardiopulmonary disease. Electronically Signed   By: Marin Roberts M.D.   On: 12/15/2020 08:01    Procedures Procedures   Medications Ordered in ED Medications  oxyCODONE-acetaminophen (PERCOCET/ROXICET) 5-325 MG per tablet 1 tablet (1 tablet Oral Given 12/15/20 1436)    ED Course  I have reviewed the triage vital signs and the nursing notes.  Pertinent labs & imaging results that were available during my care of the patient were reviewed by me and considered in my medical decision making (see chart for details).    MDM Rules/Calculators/A&P                           45 year old lady presented to the emergency room with concern for chest pain.  On exam patient is remarkably well-appearing in no distress with completely normal vital signs.  EKG without acute ischemic change and  troponin x2 is within normal limits.  Doubt ACS.  Chest x-ray negative for acute pathology and clear per my review and per radiologist.  No tachypnea, hypoxia, tachycardia, doubt PE.  Given the reassuring work-up, believe she can be discharged and managed in the outpatient setting.  Recommend she follow-up with her primary care doctor.  Given positional nature, suspect may be MSK in etiology.  She also mentioned having 1 episode of loose stools this morning which raises possibility of viral illness, will send COVID-19 testing though with lack of other specific viral symptoms have lower suspicion. Recommend supportive care, reviewed strict return precautions and discharged.    After the discussed management above, the patient was determined to be safe for discharge.  The patient was in agreement with this plan and all questions regarding their care were answered.  ED return precautions were discussed and the patient will return to the ED with any significant worsening of condition.  Final Clinical Impression(s) / ED Diagnoses Final diagnoses:  Chest pain, unspecified type    Rx / DC Orders ED Discharge Orders     None        Milagros Loll, MD 12/15/20 1458

## 2020-12-15 NOTE — ED Notes (Signed)
Pt came up to ask about wait time/acuity. Explained to the pt the process. Pt understood.
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
## 2022-02-20 ENCOUNTER — Ambulatory Visit (HOSPITAL_COMMUNITY)
Admission: EM | Admit: 2022-02-20 | Discharge: 2022-02-20 | Disposition: A | Discharge status: Home / Self Care | Payer: Commercial Managed Care - HMO | Attending: Family Medicine | Admitting: Family Medicine

## 2022-02-20 ENCOUNTER — Encounter (HOSPITAL_COMMUNITY): Payer: Self-pay | Admitting: Emergency Medicine

## 2022-02-20 ENCOUNTER — Other Ambulatory Visit: Payer: Self-pay

## 2022-02-20 DIAGNOSIS — Z3202 Encounter for pregnancy test, result negative: Secondary | ICD-10-CM

## 2022-02-20 DIAGNOSIS — N898 Other specified noninflammatory disorders of vagina: Secondary | ICD-10-CM

## 2022-02-20 DIAGNOSIS — N3000 Acute cystitis without hematuria: Secondary | ICD-10-CM | POA: Diagnosis not present

## 2022-02-20 LAB — POCT URINALYSIS DIPSTICK, ED / UC
Bilirubin Urine: NEGATIVE
Glucose, UA: NEGATIVE mg/dL
Hgb urine dipstick: NEGATIVE
Ketones, ur: NEGATIVE mg/dL
Nitrite: NEGATIVE
Protein, ur: NEGATIVE mg/dL
Specific Gravity, Urine: 1.01 (ref 1.005–1.030)
Urobilinogen, UA: 0.2 mg/dL (ref 0.0–1.0)
pH: 6 (ref 5.0–8.0)

## 2022-02-20 LAB — POC URINE PREG, ED: Preg Test, Ur: NEGATIVE

## 2022-02-20 MED ORDER — SULFAMETHOXAZOLE-TRIMETHOPRIM 800-160 MG PO TABS: 1.0000 | ORAL_TABLET | Freq: Two times a day (BID) | ORAL | 0 refills | Status: AC

## 2022-02-20 NOTE — ED Provider Notes (Signed)
Patient presents [facility]    CSN: [id number] Arrival date & time: 02/20/22  1222      History   Chief Complaint Chief Complaint  Patient presents with   Urinary Tract Infection    HPI [name] is a 45 y.o. female.   Female with a 3 to 4-day history of UTI symptoms including lower abdominal pain, dysuria, frequency, some dizziness.  She denies any fever, nausea, vomiting, diarrhea.  She does report some vaginal discharge which she describes as clear and malodorous but denies any pelvic pain.  She has tried Azo and over-the-counter yeast medication without improvement of symptoms.  Denies any recent antibiotics or medication changes.  Denies history of recurrent UTI but does have a history of UTIs in the past with similar presentation.  She denies history of kidney stones, single kidney, recent urogenital procedure, self-catheterization.  She is confident that she is not pregnant.    Past Medical History:  Diagnosis Date   ADD (attention deficit disorder) without hyperactivity    no meds   Depression    no meds   Headache(784.0)    tx w/OTC meds prn   Heartburn    MRSA (methicillin resistant staph aureus) culture positive    Obesity     Patient Active Problem List   Diagnosis Date Noted   Appendicitis 04/06/2013   Sterilization 06/11/2011   Oligohydramnios 04/10/2011    Past Surgical History:  Procedure Laterality Date   LAPAROSCOPIC APPENDECTOMY N/A 04/06/2013   Procedure: APPENDECTOMY LAPAROSCOPIC;  Surgeon: Clovis Pu. Cornett, MD;  Location: [facility] OR;  Service: General;  Laterality: N/A;   LAPAROSCOPIC TUBAL LIGATION  06/11/2011   Procedure: LAPAROSCOPIC TUBAL LIGATION;  Surgeon: Antionette Char, MD;  Location: [facility] ORS;  Service: Gynecology;  Laterality: N/A;   SVD     x 4 -3 epidurals and 1 spinal w/ SVD   TUBAL LIGATION     WISDOM TOOTH EXTRACTION      OB History     Gravida  4   Para  4   Term  4   Preterm      AB      Living  4       SAB      IAB      Ectopic      Multiple      Live Births  1            Home Medications    Prior to Admission medications   Medication Sig Start Date End Date Taking? Authorizing Provider  sulfamethoxazole-trimethoprim (BACTRIM DS) 800-160 MG tablet Take 1 tablet by mouth 2 (two) times daily for 3 days. 02/20/22 02/23/22 Yes Shanya Ferriss, Noberto Retort, PA-C  acetaminophen (TYLENOL) 500 MG tablet Take 1,000 mg by mouth every 6 (six) hours as needed for moderate pain.    [provider]  Chlorhexidine Gluconate 2 % SOLN Wash with the solution once a week for 1 month. 06/04/14   Charm Rings, MD  clindamycin (CLEOCIN) 150 MG capsule Take 3 capsules (450 mg total) by mouth 3 (three) times daily. 04/20/15   Joy, Shawn C, PA-C  cyclobenzaprine (FLEXERIL) 10 MG tablet Take 10 mg by mouth 2 (two) times daily.    [provider]  ibuprofen (ADVIL,MOTRIN) 200 MG tablet Take 400 mg by mouth every 6 (six) hours as needed (pain).    [provider]  methocarbamol (ROBAXIN) 500 MG tablet Take 1 tablet (500 mg total) by mouth 2 (two) times  daily. 10/08/14   Antony Madura, PA-C  mupirocin nasal ointment (BACTROBAN) 2 % Place 1 application into the nose 2 (two) times daily. Use one-half of tube in each nostril twice daily for five (5) days. After application, press sides of nose together and gently massage.    [provider]    Family History Family History  Problem Relation Age of Onset   Diabetes Mother    Hyperlipidemia Mother    Hypertension Mother    Cancer Maternal Aunt        thyroid   Cancer Maternal Grandmother        breast   Diabetes Father     Social History Social History   Tobacco Use   Smoking status: Some Days    Packs/day: 0.20    Years: 4.00    Total pack years: 0.80    Types: Cigarettes   Smokeless tobacco: Never   Tobacco comments:    about 2 cigarettes per day  Vaping Use   Vaping Use: Never used  Substance Use Topics   Alcohol  use: Yes    Comment: occ   Drug use: Yes    Types: Marijuana     Allergies   Patient has no known allergies.   Review of Systems Review of Systems  Constitutional:  Positive for activity change. Negative for appetite change, fatigue and fever.  Respiratory:  Negative for cough and shortness of breath.   Cardiovascular:  Negative for chest pain.  Gastrointestinal:  Positive for abdominal pain. Negative for diarrhea, nausea and vomiting.  Genitourinary:  Positive for dysuria, frequency and vaginal discharge. Negative for pelvic pain, urgency, vaginal bleeding and vaginal pain.  Neurological:  Positive for dizziness. Negative for light-headedness and headaches.     Physical Exam Triage Vital Signs ED Triage Vitals  Enc Vitals Group     BP 02/20/22 1522 123/74     Pulse Rate 02/20/22 1522 81     Resp 02/20/22 1522 20     Temp 02/20/22 1522 97.8 F (36.6 C)     Temp Source 02/20/22 1522 Oral     SpO2 02/20/22 1522 95 %     Weight --      Height --      Head Circumference --      Peak Flow --      Pain Score 02/20/22 1517 6     Pain Loc --      Pain Edu? --      Excl. in GC? --    No data found.  Updated Vital Signs BP 123/74 (BP Location: Left Arm) Comment (BP Location): large cuff  Pulse 81   Temp 97.8 F (36.6 C) (Oral)   Resp 20   LMP 01/31/2022   SpO2 95%   Visual Acuity Right Eye Distance:   Left Eye Distance:   Bilateral Distance:    Right Eye Near:   Left Eye Near:    Bilateral Near:     Physical Exam Vitals reviewed.  Constitutional:      General: She is awake. She is not in acute distress.    Appearance: Normal appearance. She is well-developed. She is not ill-appearing.     Comments: Very pleasant female appears stated age no acute distress sitting comfortably in exam room  HENT:     Head: Normocephalic and atraumatic.  Cardiovascular:     Rate and Rhythm: Normal rate and regular rhythm.     Heart sounds: Normal heart sounds, S1 normal and  S2 normal. No murmur heard. Pulmonary:     Effort: Pulmonary effort is normal.     Breath sounds: Normal breath sounds. No wheezing, rhonchi or rales.     Comments: Clear to auscultation bilaterally Abdominal:     General: Bowel sounds are normal.     Palpations: Abdomen is soft.     Tenderness: There is no abdominal tenderness. There is no right CVA tenderness, left CVA tenderness, guarding or rebound.     Comments: Benign abdominal exam; no tenderness palpation.  No CVA tenderness.  Psychiatric:        Behavior: Behavior is cooperative.      UC Treatments / Results  Labs (all labs ordered are listed, but only abnormal results are displayed) Labs Reviewed  POCT URINALYSIS DIPSTICK, ED / UC - Abnormal; Notable for the following components:      Result Value   Leukocytes,Ua TRACE (*)    All other components within normal limits  URINE CULTURE  POC URINE PREG, ED  CERVICOVAGINAL ANCILLARY ONLY    EKG   Radiology No results found.  Procedures Procedures (including critical care time)  Medications Ordered in UC Medications - No data to display  Initial Impression / Assessment and Plan / UC Course  I have reviewed the triage vital signs and the nursing notes.  Pertinent labs & imaging results that were available during my care of the patient were reviewed by me and considered in my medical decision making (see chart for details).     Patient is well-appearing, afebrile, nontoxic, nontachycardic.  She only had trace leuks on UA but given her symptoms and clinical presentation concern for UTI.  Urine pregnancy was negative.  Will empirically treat with 3 days of Bactrim DS.  Urine culture was obtained and we discussed that we will contact her if we need to discontinue or alter her antibiotics based on culture results.  Given associated discharge or swab to look for gonorrhea/chlamydia/trichomonas/bacterial vaginosis/yeast was obtained during visit.  If this is positive we  will contact her to arrange treatment.  Recommend that she rest and drink plenty of fluid.  Discussed that if she has any worsening or changing symptoms she should return for reevaluation including fever, abdominal pain, nausea, vomiting she should be seen immediately.  Strict return precautions given.  Work excuse note provided.  Final Clinical Impressions(s) / UC Diagnoses   Final diagnoses:  Acute cystitis without hematuria  Vaginal discharge     Discharge Instructions      We have sent in an antibiotic to start (sulfamethoxazole-trimethoprim).  If you develop any rash or lesions stop the medication to be seen immediately.  If you need to change or stop your medication we will contact you based on your culture results in 2 to 3 days.  We will contact you if your swab is positive we will need to arrange additional treatment.  Will treat you rest and drink plenty of fluid.  If your symptoms do not improving or if anything changes and you develop abdominal pain, pelvic pain, fever, nausea, vomiting, weakness you need to be seen immediately.     ED Prescriptions     Medication Sig Dispense Auth. Provider   sulfamethoxazole-trimethoprim (BACTRIM DS) 800-160 MG tablet Take 1 tablet by mouth 2 (two) times daily for 3 days. 6 tablet Vonn Sliger, Noberto Retort, PA-C      PDMP not reviewed this encounter.   Jeani Hawking, PA-C 02/20/22 1551

## 2022-02-20 NOTE — ED Triage Notes (Signed)
Concerned for uti symptoms for 2 days.  Reports urgency, burning with urination  patient reports right flank pain.

## 2022-02-20 NOTE — Discharge Instructions (Signed)
We have sent in an antibiotic to start (sulfamethoxazole-trimethoprim).  If you develop any rash or lesions stop the medication to be seen immediately.  If you need to change or stop your medication we will contact you based on your culture results in 2 to 3 days.  We will contact you if your swab is positive we will need to arrange additional treatment.  Will treat you rest and drink plenty of fluid.  If your symptoms do not improving or if anything changes and you develop abdominal pain, pelvic pain, fever, nausea, vomiting, weakness you need to be seen immediately.

## 2022-02-21 LAB — URINE CULTURE

## 2022-02-23 ENCOUNTER — Telehealth (HOSPITAL_COMMUNITY): Payer: Self-pay | Admitting: Emergency Medicine

## 2022-02-23 LAB — CERVICOVAGINAL ANCILLARY ONLY
Bacterial Vaginitis (gardnerella): POSITIVE — AB
Candida Glabrata: NEGATIVE
Candida Vaginitis: NEGATIVE
Chlamydia: POSITIVE — AB
Comment: NEGATIVE
Comment: NEGATIVE
Comment: NEGATIVE
Comment: NEGATIVE
Comment: NEGATIVE
Comment: NORMAL
Neisseria Gonorrhea: NEGATIVE
Trichomonas: NEGATIVE

## 2022-02-23 MED ORDER — DOXYCYCLINE HYCLATE 100 MG PO CAPS: 100.0000 mg | ORAL_CAPSULE | Freq: Two times a day (BID) | ORAL | 0 refills | Status: AC

## 2022-02-23 MED ORDER — METRONIDAZOLE 0.75 % VA GEL: 1.0000 | Freq: Every day | VAGINAL | 0 refills | Status: AC

## 2022-06-10 ENCOUNTER — Telehealth: Payer: Self-pay

## 2022-06-10 NOTE — Telephone Encounter (Signed)
Mychart msg sent

## 2022-07-17 ENCOUNTER — Encounter (HOSPITAL_COMMUNITY): Payer: Self-pay

## 2022-07-17 ENCOUNTER — Ambulatory Visit (HOSPITAL_COMMUNITY)
Admission: EM | Admit: 2022-07-17 | Discharge: 2022-07-17 | Disposition: A | Discharge status: Home / Self Care | Payer: Medicaid Other | Attending: Emergency Medicine | Admitting: Emergency Medicine

## 2022-07-17 DIAGNOSIS — J020 Streptococcal pharyngitis: Secondary | ICD-10-CM | POA: Diagnosis not present

## 2022-07-17 LAB — POCT RAPID STREP A (OFFICE): Rapid Strep A Screen: POSITIVE — AB

## 2022-07-17 MED ORDER — AMOXICILLIN 500 MG PO CAPS: 1000.0000 mg | ORAL_CAPSULE | Freq: Every day | ORAL | 0 refills | Status: AC

## 2022-07-17 NOTE — ED Triage Notes (Signed)
Chief Complaint: Patient having productive cough, sore throat, post nasal drip.   Onset: sore throat yesterday   Prescriptions or OTC medications tried: Yes- tylenol    with little relief  Sick exposure: Yes- her daughters has strep   New foods, medications, or products: No  Recent Travel: No

## 2022-07-17 NOTE — ED Provider Notes (Signed)
[facility]    CSN: [id number] Arrival date & time: 07/17/22  1007      History   Chief Complaint Chief Complaint  Patient presents with   Cough   Sore Throat    HPI [name] is a 47 y.o. female.   Patient presents to clinic for complaints of a sore throat that started late Thursday night.  Since then her sore throat has been getting worse, this morning she did notice some white spots.  Her children both were diagnosed with strep earlier in the week.  She reports she has been having pain with swallowing and noticed that her voice has changed.  She does have a cough with mucus.  Denies fevers.  Has used a throat spray and taken some Tylenol.    The history is provided by the patient and medical records.  Cough Associated symptoms: sore throat   Associated symptoms: no chest pain, no fever, no shortness of breath and no wheezing   Sore Throat Pertinent negatives include no chest pain, no abdominal pain and no shortness of breath.    Past Medical History:  Diagnosis Date   ADD (attention deficit disorder) without hyperactivity    no meds   Depression    no meds   Headache(784.0)    tx w/OTC meds prn   Heartburn    MRSA (methicillin resistant staph aureus) culture positive    Obesity     Patient Active Problem List   Diagnosis Date Noted   Appendicitis 04/06/2013   Sterilization 06/11/2011   Oligohydramnios 04/10/2011    Past Surgical History:  Procedure Laterality Date   LAPAROSCOPIC APPENDECTOMY N/A 04/06/2013   Procedure: APPENDECTOMY LAPAROSCOPIC;  Surgeon: Clovis Pu. Cornett, MD;  Location: [facility];  Service: General;  Laterality: N/A;   LAPAROSCOPIC TUBAL LIGATION  06/11/2011   Procedure: LAPAROSCOPIC TUBAL LIGATION;  Surgeon: Antionette Char, MD;  Location: [facility];  Service: Gynecology;  Laterality: N/A;   SVD     x 4 -3 epidurals and 1 spinal w/ SVD   TUBAL LIGATION     WISDOM TOOTH EXTRACTION      OB History     Gravida  4   Para   4   Term  4   Preterm      AB      Living  4      SAB      IAB      Ectopic      Multiple      Live Births  1            Home Medications    Prior to Admission medications   Medication Sig Start Date End Date Taking? Authorizing Provider  acetaminophen (TYLENOL) 500 MG tablet Take 1,000 mg by mouth every 6 (six) hours as needed for moderate pain.   Yes [provider]  amoxicillin (AMOXIL) 500 MG capsule Take 2 capsules (1,000 mg total) by mouth daily for 10 days. 07/17/22 07/27/22 Yes Dasean Brow, [place], FNP    Family History Family History  Problem Relation Age of Onset   Diabetes Mother    Hyperlipidemia Mother    Hypertension Mother    Cancer Maternal Aunt        thyroid   Cancer Maternal Grandmother        breast   Diabetes Father     Social History Social History   Tobacco Use   Smoking status: Some Days    Packs/day: 0.20  Years: 4.00    Additional pack years: 0.00    Total pack years: 0.80    Types: Cigarettes   Smokeless tobacco: Never   Tobacco comments:    about 2 cigarettes per day  Vaping Use   Vaping Use: Never used  Substance Use Topics   Alcohol use: Yes    Comment: occ   Drug use: Yes    Types: Marijuana     Allergies   Patient has no known allergies.   Review of Systems Review of Systems  Constitutional:  Negative for fatigue and fever.  HENT:  Positive for sore throat, trouble swallowing and voice change.   Respiratory:  Positive for cough. Negative for shortness of breath and wheezing.   Cardiovascular:  Negative for chest pain.  Gastrointestinal:  Negative for abdominal pain.     Physical Exam Triage Vital Signs ED Triage Vitals  Enc Vitals Group     BP 07/17/22 1032 128/86     Pulse Rate 07/17/22 1032 94     Resp 07/17/22 1032 18     Temp 07/17/22 1032 98.2 F (36.8 C)     Temp Source 07/17/22 1032 Oral     SpO2 07/17/22 1032 98 %     Weight 07/17/22 1031 (!) 318 lb 2 oz (144.3 kg)      Height 07/17/22 1031 5\' 7"  (1.702 m)     Head Circumference --      Peak Flow --      Pain Score 07/17/22 1028 5     Pain Loc --      Pain Edu? --      Excl. in GC? --    No data found.  Updated Vital Signs BP 128/86 (BP Location: Left Arm)   Pulse 94   Temp 98.2 F (36.8 C) (Oral)   Resp 18   Ht 5\' 7"  (1.702 m)   Wt (!) 318 lb 2 oz (144.3 kg)   LMP 07/08/2022 (Approximate)   SpO2 98%   BMI 49.83 kg/m   Visual Acuity Right Eye Distance:   Left Eye Distance:   Bilateral Distance:    Right Eye Near:   Left Eye Near:    Bilateral Near:     Physical Exam Vitals and nursing note reviewed.  Constitutional:      Appearance: She is well-developed.  HENT:     Head: Normocephalic.     Right Ear: External ear normal.     Left Ear: External ear normal.     Nose: Rhinorrhea present.     Mouth/Throat:     Mouth: Mucous membranes are moist.     Pharynx: Posterior oropharyngeal erythema present.     Tonsils: Tonsillar exudate present. No tonsillar abscesses. 2+ on the right. 2+ on the left.  Eyes:     General: No scleral icterus.    Conjunctiva/sclera: Conjunctivae normal.  Cardiovascular:     Rate and Rhythm: Normal rate and regular rhythm.     Heart sounds: Normal heart sounds. No murmur heard. Pulmonary:     Effort: Pulmonary effort is normal. No respiratory distress.     Breath sounds: Normal breath sounds.  Musculoskeletal:     Cervical back: Normal range of motion and neck supple.  Skin:    General: Skin is warm and dry.  Neurological:     General: No focal deficit present.     Mental Status: She is alert and oriented to person, place, and time.  Psychiatric:  Mood and Affect: Mood normal.        Behavior: Behavior normal.      UC Treatments / Results  Labs (all labs ordered are listed, but only abnormal results are displayed) Labs Reviewed  POCT RAPID STREP A (OFFICE) - Abnormal; Notable for the following components:      Result Value   Rapid  Strep A Screen Positive (*)    All other components within normal limits    EKG   Radiology No results found.  Procedures Procedures (including critical care time)  Medications Ordered in UC Medications - No data to display  Initial Impression / Assessment and Plan / UC Course  I have reviewed the triage vital signs and the nursing notes.  Pertinent labs & imaging results that were available during my care of the patient were reviewed by me and considered in my medical decision making (see chart for details).  Vitals and triage reviewed, patient is hemodynamically stable.  Patient reports recent exposure to strep, sore throat, tonsils with 2+ erythema and scant white patches bilaterally.  Rapid strep in clinic positive, will cover with amoxicillin.  Uvula midline, low concern for peritonsillar abscess.  Plan of care reviewed with patient, return precautions and follow-up care discussed, no questions at this time.      Final Clinical Impressions(s) / UC Diagnoses   Final diagnoses:  Strep pharyngitis     Discharge Instructions      Your strep test was positive today in clinic, please take all antibiotics as prescribed and until finished.  You can take them with food to help prevent gastrointestinal upset.  For symptomatic relief of your sore throat you can do warm saline gargles, tea with honey, sleep with a humidifier and lozenges or throat sprays.  You can also alternate between Tylenol and ibuprofen every 4-6 hours for any pain, fevers or discomfort.  Please dispose of your toothbrush or sterilize your toothbrush within 24 hours on antibiotics to help prevent reinfection.  Please return to clinic or follow-up with your primary care if your symptoms persist beyond antibiotics.  Please seek immediate care if you are not able to control your secretions, drooling, or unable to swallow.      ED Prescriptions     Medication Sig Dispense Auth. Provider   amoxicillin (AMOXIL)  500 MG capsule Take 2 capsules (1,000 mg total) by mouth daily for 10 days. 20 capsule Lashona Schaaf, [place], [place]      PDMP not reviewed this encounter.   Broady Lafoy, [place] N, [place] 07/17/22 [phone]

## 2022-07-17 NOTE — Discharge Instructions (Addendum)
Your strep test was positive today in clinic, please take all antibiotics as prescribed and until finished.  You can take them with food to help prevent gastrointestinal upset.  For symptomatic relief of your sore throat you can do warm saline gargles, tea with honey, sleep with a humidifier and lozenges or throat sprays.  You can also alternate between Tylenol and ibuprofen every 4-6 hours for any pain, fevers or discomfort.  Please dispose of your toothbrush or sterilize your toothbrush within 24 hours on antibiotics to help prevent reinfection.  Please return to clinic or follow-up with your primary care if your symptoms persist beyond antibiotics.  Please seek immediate care if you are not able to control your secretions, drooling, or unable to swallow.

## 2022-08-23 ENCOUNTER — Encounter (HOSPITAL_COMMUNITY): Payer: Self-pay | Admitting: *Deleted

## 2022-08-23 ENCOUNTER — Other Ambulatory Visit: Payer: Self-pay

## 2022-08-23 ENCOUNTER — Ambulatory Visit (HOSPITAL_COMMUNITY)
Admission: EM | Admit: 2022-08-23 | Discharge: 2022-08-23 | Disposition: A | Discharge status: Home / Self Care | Payer: Medicaid Other | Attending: Internal Medicine | Admitting: Internal Medicine

## 2022-08-23 DIAGNOSIS — M542 Cervicalgia: Secondary | ICD-10-CM | POA: Diagnosis not present

## 2022-08-23 MED ORDER — BACLOFEN 5 MG PO TABS: 5.0000 mg | ORAL_TABLET | Freq: Three times a day (TID) | ORAL | 0 refills | Status: DC | PRN

## 2022-08-23 MED ORDER — KETOROLAC TROMETHAMINE 30 MG/ML IJ SOLN
INTRAMUSCULAR | Status: AC
  Filled 2022-08-23: qty 1

## 2022-08-23 MED ORDER — KETOROLAC TROMETHAMINE 30 MG/ML IJ SOLN
30.0000 mg | Freq: Once | INTRAMUSCULAR | Status: AC
  Administered 2022-08-23: 30 mg via INTRAMUSCULAR
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
## 2022-08-23 NOTE — Discharge Instructions (Signed)
You were also given a prescription for a muscle relaxer (baclofen) take at bedtime when you are not driving or working because it may cause dizziness/drowsiness.  You were given injections (Toradol) for your pain today.  This an anti-inflammatory for pain and inflammation.  Avoid taking any more NSAIDs today such as ibuprofen, Motrin, Advil, Aleve due to injection today.  Recommend rest, ice, compression, and elevation. You can alternate Tylenol with Ibuprofen as needed for pain if you are not allergic.   Return in 2 to 3 days if no improvement. Your evaluation was not suggestive of any emergent condition requiring medical intervention at this time. However, our office is limited in the tests and imaging we can perform at our facility.  Therefore, it is very important for you to pay attention to any new symptoms or worsening of your current condition.   Please go directly to the Emergency Department immediately should you begin to feel worse in any way or have any of the following symptoms: bowel/urinary incontinence, numbness between your legs, fever new onset numbness/tingling.

## 2022-09-12 ENCOUNTER — Encounter (HOSPITAL_COMMUNITY): Payer: Self-pay

## 2022-09-12 ENCOUNTER — Ambulatory Visit (HOSPITAL_COMMUNITY)
Admission: EM | Admit: 2022-09-12 | Discharge: 2022-09-12 | Disposition: A | Discharge status: Home / Self Care | Payer: Medicaid Other | Attending: Internal Medicine | Admitting: Internal Medicine

## 2022-09-12 DIAGNOSIS — W19XXXA Unspecified fall, initial encounter: Secondary | ICD-10-CM | POA: Diagnosis not present

## 2022-09-12 DIAGNOSIS — M25552 Pain in left hip: Secondary | ICD-10-CM

## 2022-09-12 DIAGNOSIS — M25562 Pain in left knee: Secondary | ICD-10-CM

## 2022-09-12 MED ORDER — KETOROLAC TROMETHAMINE 30 MG/ML IJ SOLN
INTRAMUSCULAR | Status: AC
  Filled 2022-09-12: qty 1

## 2022-09-12 MED ORDER — CYCLOBENZAPRINE HCL 10 MG PO TABS: 10.0000 mg | ORAL_TABLET | Freq: Two times a day (BID) | ORAL | 0 refills | Status: DC | PRN

## 2022-09-12 MED ORDER — KETOROLAC TROMETHAMINE 30 MG/ML IJ SOLN
30.0000 mg | Freq: Once | INTRAMUSCULAR | Status: AC
  Administered 2022-09-12: 30 mg via INTRAMUSCULAR

## 2022-09-12 NOTE — ED Provider Notes (Signed)
[facility]    CSN: [id number] Arrival date & time: 09/12/22  1626      History   Chief Complaint No chief complaint on file.   HPI [name] is a 47 y.o. female.   Patient presents to urgent care for evaluation of left hip and left knee pain that started a few hours ago. She was walking at [facility] when she accidentally tripped and fell onto her right side after slipping on some water on the floor. She did not hit her head and did not pass out. She states she hyperextended the left leg at the knee joint and is now having pain to the left anterior knee and the left shin. Left knee pain is 7/10 and described as pulling tight sensation. Left hip pain is 9/10 and described as a pull and a sharp pain. She was able to stand up and walk after the accident. No numbness/tingling distally. No previous injuries to the left hip/left knee.  She took tylenol at 2pm without much relief.      Past Medical History:  Diagnosis Date   ADD (attention deficit disorder) without hyperactivity    no meds   Depression    no meds   Headache(784.0)    tx w/OTC meds prn   Heartburn    MRSA (methicillin resistant staph aureus) culture positive    Obesity     Patient Active Problem List   Diagnosis Date Noted   Appendicitis 04/06/2013   Sterilization 06/11/2011   Oligohydramnios 04/10/2011    Past Surgical History:  Procedure Laterality Date   LAPAROSCOPIC APPENDECTOMY N/A 04/06/2013   Procedure: APPENDECTOMY LAPAROSCOPIC;  Surgeon: Clovis Pu. Cornett, MD;  Location: [facility];  Service: General;  Laterality: N/A;   LAPAROSCOPIC TUBAL LIGATION  06/11/2011   Procedure: LAPAROSCOPIC TUBAL LIGATION;  Surgeon: Antionette Char, MD;  Location: [facility];  Service: Gynecology;  Laterality: N/A;   SVD     x 4 -3 epidurals and 1 spinal w/ SVD   TUBAL LIGATION     WISDOM TOOTH EXTRACTION      OB History     Gravida  4   Para  4   Term  4   Preterm      AB      Living  4      SAB       IAB      Ectopic      Multiple      Live Births  1            Home Medications    Prior to Admission medications   Medication Sig Start Date End Date Taking? Authorizing Provider  cyclobenzaprine (FLEXERIL) 10 MG tablet Take 1 tablet (10 mg total) by mouth 2 (two) times daily as needed for muscle spasms. 09/12/22  Yes Carlisle Beers, FNP  acetaminophen (TYLENOL) 500 MG tablet Take 1,000 mg by mouth every 6 (six) hours as needed for moderate pain.    [provider]    Family History Family History  Problem Relation Age of Onset   Diabetes Mother    Hyperlipidemia Mother    Hypertension Mother    Cancer Maternal Aunt        thyroid   Cancer Maternal Grandmother        breast   Diabetes Father     Social History Social History   Tobacco Use   Smoking status: Some Days    Packs/day: 0.20    Years:  4.00    Additional pack years: 0.00    Total pack years: 0.80    Types: Cigarettes   Smokeless tobacco: Never   Tobacco comments:    about 2 cigarettes per day  Vaping Use   Vaping Use: Never used  Substance Use Topics   Alcohol use: Yes    Comment: occ   Drug use: Yes    Types: Marijuana     Allergies   Patient has no known allergies.   Review of Systems Review of Systems Per HPI  Physical Exam Triage Vital Signs ED Triage Vitals  Enc Vitals Group     BP 09/12/22 1712 111/80     Pulse Rate 09/12/22 1708 89     Resp 09/12/22 1708 18     Temp 09/12/22 1708 98 F (36.7 C)     Temp Source 09/12/22 1708 Oral     SpO2 09/12/22 1708 97 %     Weight --      Height --      Head Circumference --      Peak Flow --      Pain Score --      Pain Loc --      Pain Edu? --      Excl. in GC? --    No data found.  Updated Vital Signs BP 111/80 (BP Location: Left Arm)   Pulse 89   Temp 98 F (36.7 C) (Oral)   Resp 18   LMP 07/23/2022   SpO2 98%   Visual Acuity Right Eye Distance:   Left Eye Distance:   Bilateral Distance:     Right Eye Near:   Left Eye Near:    Bilateral Near:     Physical Exam Vitals and nursing note reviewed.  Constitutional:      Appearance: She is not ill-appearing or toxic-appearing.  HENT:     Head: Normocephalic and atraumatic.     Right Ear: Hearing and external ear normal.     Left Ear: Hearing and external ear normal.     Nose: Nose normal.     Mouth/Throat:     Lips: Pink.     Mouth: Mucous membranes are moist. No injury.     Tongue: No lesions. Tongue does not deviate from midline.     Palate: No mass and lesions.     Pharynx: Oropharynx is clear. Uvula midline. No pharyngeal swelling, oropharyngeal exudate, posterior oropharyngeal erythema or uvula swelling.     Tonsils: No tonsillar exudate or tonsillar abscesses.  Eyes:     General: Lids are normal. Vision grossly intact. Gaze aligned appropriately.     Extraocular Movements: Extraocular movements intact.     Conjunctiva/sclera: Conjunctivae normal.  Cardiovascular:     Rate and Rhythm: Normal rate and regular rhythm.     Heart sounds: Normal heart sounds, S1 normal and S2 normal.  Pulmonary:     Effort: Pulmonary effort is normal. No respiratory distress.     Breath sounds: Normal breath sounds and air entry.  Musculoskeletal:     Cervical back: Normal and neck supple.     Thoracic back: Normal.     Lumbar back: Normal.     Right hip: Normal.     Left hip: No deformity, lacerations, tenderness, bony tenderness or crepitus. Normal range of motion. Normal strength.     Right lower leg: Normal. No edema.     Left lower leg: Tenderness (mild tenderness to palpation to the anterior inferior left  knee) present. No swelling, deformity, lacerations or bony tenderness. No edema.     Comments: Ambulatory with steady gait. Strength and sensation intact to bilateral upper/lower extremities. No ecchymosis.   Skin:    General: Skin is warm and dry.     Capillary Refill: Capillary refill takes less than 2 seconds.      Findings: No rash.  Neurological:     General: No focal deficit present.     Mental Status: She is alert and oriented to person, place, and time. Mental status is at baseline.     Cranial Nerves: No dysarthria or facial asymmetry.  Psychiatric:        Mood and Affect: Mood normal.        Speech: Speech normal.        Behavior: Behavior normal.        Thought Content: Thought content normal.        Judgment: Judgment normal.      UC Treatments / Results  Labs (all labs ordered are listed, but only abnormal results are displayed) Labs Reviewed - No data to display  EKG   Radiology No results found.  Procedures Procedures (including critical care time)  Medications Ordered in UC Medications  ketorolac (TORADOL) 30 MG/ML injection 30 mg (30 mg Intramuscular Given 09/12/22 1742)    Initial Impression / Assessment and Plan / UC Course  I have reviewed the triage vital signs and the nursing notes.  Pertinent labs & imaging results that were available during my care of the patient were reviewed by me and considered in my medical decision making (see chart for details).   1. Fall, left hip pain, acute pain of left knee Musculoskeletal pain related to fall will be treated with ketorolac in clinic (30 mg IM), ibuprofen starting tomorrow at home as needed, and Flexeril muscle relaxer as needed.  Drowsiness precautions discussed regarding muscle relaxer use.  May ice injuries today, then use heat and gentle range of motion exercises starting tomorrow.  No NSAID for 24 hours due to ketorolac in clinic.  Deferred imaging based on stable musculoskeletal exam findings in clinic and low suspicion for acute bony abnormality/dislocation.  Neurovascularly intact distally to injuries.  May follow-up with orthopedic provider listed on paperwork as needed.  Discussed red flag signs and symptoms of worsening condition,when to call the PCP office, return to urgent care, and when to seek higher level  of care in the emergency department. Counseled patient regarding appropriate use of medications and potential side effects for all medications recommended or prescribed today. Patient verbalizes understanding and agreement with plan. Discharged in stable condition.     Final Clinical Impressions(s) / UC Diagnoses   Final diagnoses:  Fall, initial encounter  Left hip pain  Acute pain of left knee     Discharge Instructions      Your pain is likely due to a muscle strain which will improve on its own with time.   - Take ibuprofen 600mg  with food every 6 hours as needed for pain and inflammation. Do not take any other NSAID containing medicine when taking ibuprofen.   - You may start taking ibuprofen tomorrow since you were given a dose of ketorolac injection in clinic today for pain and inflammation. - You may also take the prescribed muscle relaxer as directed as needed for muscle aches/spasm.  Do not take this medication and drive or drink alcohol as it can make you sleepy.  Mainly use this medicine at nighttime  as needed. - Apply heat 20 minutes on then 20 minutes off and perform gentle range of motion exercises to the area of greatest pain to prevent muscle stiffness and provide further pain relief.   Red flag symptoms to watch out for are numbness/tingling to the legs, weakness, loss of bowel/bladder control, and/or worsening pain that does not respond well to medicines. Follow-up with your primary care provider or return to urgent care if your symptoms do not improve in the next 3 to 4 days with medications and interventions recommended today. If your symptoms are severe (red flag), please go to the emergency room.  I hope you feel better!      ED Prescriptions     Medication Sig Dispense Auth. Provider   cyclobenzaprine (FLEXERIL) 10 MG tablet Take 1 tablet (10 mg total) by mouth 2 (two) times daily as needed for muscle spasms. 20 tablet Carlisle Beers, FNP      PDMP  not reviewed this encounter.   Carlisle Beers, [place] 09/12/22 1744

## 2022-09-12 NOTE — ED Triage Notes (Signed)
Pt presents for a fall today at [facility]. Pt reports L-knee pain and hip pain. Denies any LOC.

## 2022-09-12 NOTE — Discharge Instructions (Signed)
Your pain is likely due to a muscle strain which will improve on its own with time.   - Take ibuprofen 600mg with food every 6 hours as needed for pain and inflammation. Do not take any other NSAID containing medicine when taking ibuprofen.   - You may start taking ibuprofen tomorrow since you were given a dose of ketorolac injection in clinic today for pain and inflammation. - You may also take the prescribed muscle relaxer as directed as needed for muscle aches/spasm.  Do not take this medication and drive or drink alcohol as it can make you sleepy.  Mainly use this medicine at nighttime as needed. - Apply heat 20 minutes on then 20 minutes off and perform gentle range of motion exercises to the area of greatest pain to prevent muscle stiffness and provide further pain relief.   Red flag symptoms to watch out for are numbness/tingling to the legs, weakness, loss of bowel/bladder control, and/or worsening pain that does not respond well to medicines. Follow-up with your primary care provider or return to urgent care if your symptoms do not improve in the next 3 to 4 days with medications and interventions recommended today. If your symptoms are severe (red flag), please go to the emergency room.  I hope you feel better!  

## 2022-09-24 ENCOUNTER — Emergency Department (HOSPITAL_BASED_OUTPATIENT_CLINIC_OR_DEPARTMENT_OTHER): Payer: Medicaid Other

## 2022-09-24 ENCOUNTER — Emergency Department (HOSPITAL_BASED_OUTPATIENT_CLINIC_OR_DEPARTMENT_OTHER)
Admission: EM | Admit: 2022-09-24 | Discharge: 2022-09-24 | Disposition: A | Discharge status: Home / Self Care | Payer: Medicaid Other | Attending: Emergency Medicine | Admitting: Emergency Medicine

## 2022-09-24 ENCOUNTER — Other Ambulatory Visit: Payer: Self-pay

## 2022-09-24 ENCOUNTER — Encounter (HOSPITAL_BASED_OUTPATIENT_CLINIC_OR_DEPARTMENT_OTHER): Payer: Self-pay

## 2022-09-24 DIAGNOSIS — M542 Cervicalgia: Secondary | ICD-10-CM | POA: Diagnosis not present

## 2022-09-24 DIAGNOSIS — Y92512 Supermarket, store or market as the place of occurrence of the external cause: Secondary | ICD-10-CM | POA: Diagnosis not present

## 2022-09-24 DIAGNOSIS — W19XXXA Unspecified fall, initial encounter: Secondary | ICD-10-CM | POA: Insufficient documentation

## 2022-09-24 DIAGNOSIS — M25512 Pain in left shoulder: Secondary | ICD-10-CM | POA: Diagnosis not present

## 2022-09-24 DIAGNOSIS — M25511 Pain in right shoulder: Secondary | ICD-10-CM | POA: Insufficient documentation

## 2022-09-24 MED ORDER — METHYLPREDNISOLONE 4 MG PO TBPK: ORAL_TABLET | ORAL | 0 refills | Status: DC

## 2022-09-24 MED ORDER — HYDROCODONE-ACETAMINOPHEN 5-325 MG PO TABS
1.0000 | ORAL_TABLET | Freq: Once | ORAL | Status: AC
  Administered 2022-09-24: 1 via ORAL
  Filled 2022-09-24: qty 1

## 2022-09-24 MED ORDER — CELECOXIB 200 MG PO CAPS: 200.0000 mg | ORAL_CAPSULE | Freq: Two times a day (BID) | ORAL | 0 refills | Status: DC

## 2022-09-24 MED ORDER — BACLOFEN 10 MG PO TABS: 10.0000 mg | ORAL_TABLET | Freq: Three times a day (TID) | ORAL | 0 refills | Status: DC | PRN

## 2022-09-24 MED ORDER — ONDANSETRON 4 MG PO TBDP
4.0000 mg | ORAL_TABLET | Freq: Once | ORAL | Status: DC
  Filled 2022-09-24: qty 1

## 2022-09-24 NOTE — Discharge Instructions (Addendum)
Contact a health care provider if: Your pain is not relieved with medicines. New pain develops in your arm, hand, or fingers. You loosen your sling and your arm, hand, or fingers remain tingly, numb, swollen, or painful. Get help right away if: Your arm, hand, or fingers turn white or blue. 

## 2022-09-24 NOTE — ED Triage Notes (Signed)
Patient slipped on water now has pain to her neck and left shoulder two weeks ago. Hx of pain in that area previously.

## 2022-09-24 NOTE — ED Provider Notes (Signed)
[place] [facility] Provider Note   CSN: [id number] Arrival date & time: 09/24/22  1325     History  Chief Complaint  Patient presents with   Shoulder Injury    [name] is a 47 y.o. female who presents emergency department with a chief complaint of neck and shoulder pain on the right.  She has a history of previous pain in this area however she fell at [facility] 3 weeks ago and since that time has had severe pain and difficulty lifting the shoulder.  She states that she has tried over-the-counter medications without pain relief.  She is able to move her arm at the elbow but when she tries to move her arm at the shoulder she has to use the other hand to support it due to pain.  She she denies weakness of grip strength but does state that she has had numbness and tingling in her fingers intermittently.   Shoulder Injury       Home Medications Prior to Admission medications   Medication Sig Start Date End Date Taking? Authorizing Provider  acetaminophen (TYLENOL) 500 MG tablet Take 1,000 mg by mouth every 6 (six) hours as needed for moderate pain.    [provider]  cyclobenzaprine (FLEXERIL) 10 MG tablet Take 1 tablet (10 mg total) by mouth 2 (two) times daily as needed for muscle spasms. 09/12/22   Carlisle Beers, FNP      Allergies    Patient has no known allergies.    Review of Systems   Review of Systems  Physical Exam Updated Vital Signs BP 134/73 (BP Location: Left Arm)   Pulse 94   Temp 98.3 F (36.8 C) (Oral)   Resp 18   Ht 5\' 8"  (1.727 m)   Wt (!) 144 kg   LMP 09/24/2022 (Exact Date)   SpO2 95%   BMI 48.27 kg/m  Physical Exam Vitals and nursing note reviewed.  Constitutional:      General: She is not in acute distress.    Appearance: She is well-developed. She is not diaphoretic.  HENT:     Head: Normocephalic and atraumatic.     Right Ear: External ear normal.     Left Ear: External ear normal.      Nose: Nose normal.     Mouth/Throat:     Mouth: Mucous membranes are moist.  Eyes:     General: No scleral icterus.    Conjunctiva/sclera: Conjunctivae normal.  Neck:     Comments: No midline tenderness.  Tenderness to palpation along the right paraspinal muscles.  Tenderness along right trapezius.  Range of motion limited at the right shoulder due to severe pain with movement.  Normal grip strength bilaterally Cardiovascular:     Rate and Rhythm: Normal rate and regular rhythm.     Heart sounds: Normal heart sounds. No murmur heard.    No friction rub. No gallop.  Pulmonary:     Effort: Pulmonary effort is normal. No respiratory distress.     Breath sounds: Normal breath sounds.  Abdominal:     General: Bowel sounds are normal. There is no distension.     Palpations: Abdomen is soft. There is no mass.     Tenderness: There is no abdominal tenderness. There is no guarding.  Skin:    General: Skin is warm and dry.  Neurological:     Mental Status: She is alert and oriented to person, place, and time.  Psychiatric:  Behavior: Behavior normal.     ED Results / Procedures / Treatments   Labs (all labs ordered are listed, but only abnormal results are displayed) Labs Reviewed - No data to display  EKG None  Radiology No results found.  Procedures Procedures    Medications Ordered in ED Medications  HYDROcodone-acetaminophen (NORCO/VICODIN) 5-325 MG per tablet 1 tablet (has no administration in time range)  ondansetron (ZOFRAN-ODT) disintegrating tablet 4 mg (has no administration in time range)    ED Course/ Medical Decision Making/ A&P Clinical Course as of 09/24/22 1649  Fri Sep 24, 2022  1642 CT Cervical Spine Wo Contrast [AH]  1642 DG Shoulder Right I visualized and interpreted CT C-spine and right shoulder x-ray.  No acute findings noted. [AH]    Clinical Course User Index [AH] Arthor Captain, PA-C                             Medical Decision  Making Amount and/or Complexity of Data Reviewed Radiology: ordered. Decision-making details documented in ED Course.  Risk Prescription drug management.           Final Clinical Impression(s) / ED Diagnoses Final diagnoses:  None    Rx / DC Orders ED Discharge Orders     None         Arthor Captain, PA-C 09/24/22 1649    Vanetta Mulders, MD 09/24/22 2112

## 2022-09-27 NOTE — Progress Notes (Unsigned)
Office Visit Note   Patient: [name]           Date of Birth: [birth year]           MRN: [id number] Visit Date: 09/28/2022              Requested by: No referring provider defined for this encounter. PCP: Patient, No Pcp Per   Assessment & Plan: Visit Diagnoses:  1. Acute pain of right shoulder     Plan: Patient is a 47 year old female with right shoulder pain unclear etiology.  Nothing seems to be structurally abnormal.  I recommend giving it more time.  I sent in prescription for diclofenac and we will make a referral to outpatient PT.  Follow-Up Instructions: No follow-ups on file.   Orders:  Orders Placed This Encounter  Procedures   Ambulatory referral to Physical Therapy   Meds ordered this encounter  Medications   diclofenac (VOLTAREN) 75 MG EC tablet    Sig: Take 1 tablet (75 mg total) by mouth 2 (two) times daily.    Dispense:  30 tablet    Refill:  2      Procedures: No procedures performed   Clinical Data: No additional findings.   Subjective: Chief Complaint  Patient presents with   Right Shoulder - Pain    HPI Patient is a 47 year old female here for evaluation of right shoulder pain that started a week after she fell at [facility] 2 weeks ago.  She reports pain to the right trapezial region and pain with movement of the shoulder in all directions.  She was given oral steroids and muscle relaxers by the ED which has provided slight improvement. Review of Systems  Constitutional: Negative.   HENT: Negative.    Eyes: Negative.   Respiratory: Negative.    Cardiovascular: Negative.   Endocrine: Negative.   Musculoskeletal: Negative.   Neurological: Negative.   Hematological: Negative.   Psychiatric/Behavioral: Negative.    All other systems reviewed and are negative.    Objective: Vital Signs: LMP 09/24/2022 (Exact Date)   Physical Exam Vitals and nursing note reviewed.  Constitutional:      Appearance: She is well-developed.  HENT:      Head: Atraumatic.     Nose: Nose normal.  Eyes:     Extraocular Movements: Extraocular movements intact.  Cardiovascular:     Pulses: Normal pulses.  Pulmonary:     Effort: Pulmonary effort is normal.  Abdominal:     Palpations: Abdomen is soft.  Musculoskeletal:     Cervical back: Neck supple.  Skin:    General: Skin is warm.     Capillary Refill: Capillary refill takes less than 2 seconds.  Neurological:     Mental Status: She is alert. Mental status is at baseline.  Psychiatric:        Behavior: Behavior normal.        Thought Content: Thought content normal.        Judgment: Judgment normal.    Ortho Exam Examination of the right shoulder shows normal range of motion with mild guarding to range of motion and manual muscle testing.  No focal deficits on exam. Specialty Comments:  No specialty comments available.  Imaging: No results found.   PMFS History: Patient Active Problem List   Diagnosis Date Noted   Appendicitis 04/06/2013   Sterilization 06/11/2011   Oligohydramnios 04/10/2011   Past Medical History:  Diagnosis Date   ADD (attention deficit disorder) without hyperactivity  no meds   Depression    no meds   Headache(784.0)    tx w/OTC meds prn   Heartburn    MRSA (methicillin resistant staph aureus) culture positive    Obesity     Family History  Problem Relation Age of Onset   Diabetes Mother    Hyperlipidemia Mother    Hypertension Mother    Cancer Maternal Aunt        thyroid   Cancer Maternal Grandmother        breast   Diabetes Father     Past Surgical History:  Procedure Laterality Date   LAPAROSCOPIC APPENDECTOMY N/A 04/06/2013   Procedure: APPENDECTOMY LAPAROSCOPIC;  Surgeon: Clovis Pu. Cornett, MD;  Location: [facility];  Service: General;  Laterality: N/A;   LAPAROSCOPIC TUBAL LIGATION  06/11/2011   Procedure: LAPAROSCOPIC TUBAL LIGATION;  Surgeon: Antionette Char, MD;  Location: [facility];  Service: Gynecology;  Laterality: N/A;    SVD     x 4 -3 epidurals and 1 spinal w/ SVD   TUBAL LIGATION     WISDOM TOOTH EXTRACTION     Social History   Occupational History   Not on file  Tobacco Use   Smoking status: Some Days    Packs/day: 0.20    Years: 4.00    Additional pack years: 0.00    Total pack years: 0.80    Types: Cigarettes   Smokeless tobacco: Never   Tobacco comments:    about 2 cigarettes per day  Vaping Use   Vaping Use: Never used  Substance and Sexual Activity   Alcohol use: Yes    Comment: occ   Drug use: Yes    Types: Marijuana   Sexual activity: Yes    Birth control/protection: Surgical

## 2022-09-28 ENCOUNTER — Encounter: Payer: Self-pay | Admitting: Orthopaedic Surgery

## 2022-09-28 ENCOUNTER — Ambulatory Visit: Payer: Medicaid Other | Admitting: Orthopaedic Surgery

## 2022-09-28 DIAGNOSIS — M25511 Pain in right shoulder: Secondary | ICD-10-CM | POA: Diagnosis not present

## 2022-09-28 MED ORDER — DICLOFENAC SODIUM 75 MG PO TBEC
75.0000 mg | DELAYED_RELEASE_TABLET | Freq: Two times a day (BID) | ORAL | 2 refills | Status: DC
Start: 2022-09-28 — End: 2023-03-03

## 2022-09-30 DIAGNOSIS — Z13220 Encounter for screening for lipoid disorders: Secondary | ICD-10-CM | POA: Diagnosis not present

## 2022-09-30 DIAGNOSIS — Z131 Encounter for screening for diabetes mellitus: Secondary | ICD-10-CM | POA: Diagnosis not present

## 2022-09-30 DIAGNOSIS — Z0000 Encounter for general adult medical examination without abnormal findings: Secondary | ICD-10-CM | POA: Diagnosis not present

## 2022-09-30 DIAGNOSIS — R5383 Other fatigue: Secondary | ICD-10-CM | POA: Diagnosis not present

## 2022-10-13 ENCOUNTER — Other Ambulatory Visit: Payer: Self-pay

## 2022-10-13 ENCOUNTER — Ambulatory Visit: Payer: Medicaid Other | Attending: Orthopaedic Surgery | Admitting: Physical Therapy

## 2022-10-13 ENCOUNTER — Encounter: Payer: Self-pay | Admitting: Physical Therapy

## 2022-10-13 DIAGNOSIS — M6281 Muscle weakness (generalized): Secondary | ICD-10-CM

## 2022-10-13 DIAGNOSIS — M25511 Pain in right shoulder: Secondary | ICD-10-CM | POA: Diagnosis not present

## 2022-10-13 NOTE — Therapy (Signed)
OUTPATIENT PHYSICAL THERAPY SHOULDER EVALUATION   Patient Name: [name] MRN: [id number] DOB:Oct 20, 1975, 47 y.o., female Today's Date: 10/13/2022   PT End of Session - 10/13/22 1636     Visit Number 1    Date for PT Re-Evaluation 12/08/22    Authorization Type MCD - [employer]    PT Start Time 1615    PT Stop Time 1645    PT Time Calculation (min) 30 min             Past Medical History:  Diagnosis Date   ADD (attention deficit disorder) without hyperactivity    no meds   Depression    no meds   Headache(784.0)    tx w/OTC meds prn   Heartburn    MRSA (methicillin resistant staph aureus) culture positive    Obesity    Past Surgical History:  Procedure Laterality Date   LAPAROSCOPIC APPENDECTOMY N/A 04/06/2013   Procedure: APPENDECTOMY LAPAROSCOPIC;  Surgeon: Clovis Pu. Cornett, MD;  Location: [facility];  Service: General;  Laterality: N/A;   LAPAROSCOPIC TUBAL LIGATION  06/11/2011   Procedure: LAPAROSCOPIC TUBAL LIGATION;  Surgeon: Antionette Char, MD;  Location: [facility];  Service: Gynecology;  Laterality: N/A;   SVD     x 4 -3 epidurals and 1 spinal w/ SVD   TUBAL LIGATION     WISDOM TOOTH EXTRACTION     Patient Active Problem List   Diagnosis Date Noted   Appendicitis 04/06/2013   Sterilization 06/11/2011   Oligohydramnios 04/10/2011    PCP: Patient, No Pcp Per  REFERRING PROVIDER: Tarry Kos, MD  THERAPY DIAG:  Right shoulder pain, unspecified chronicity - Plan: PT plan of care cert/re-cert  Muscle weakness - Plan: PT plan of care cert/re-cert  REFERRING DIAG: R shoulder pain  Rationale for Evaluation and Treatment:  Rehabilitation  SUBJECTIVE:  PERTINENT PAST HISTORY:  none        PRECAUTIONS: None  WEIGHT BEARING RESTRICTIONS No  FALLS:  Has patient fallen in last 6 months? Yes, Number of falls: fall in [employer] when she slipped in a puddle  MOI/History of condition:  Onset date: ~1 month  SUBJECTIVE STATEMENT  [name]  is a 47 y.o. female who presents to clinic with chief complaint of R shoulder pain and soreness.  She had a fall at [employer] in a puddle when she landed on her R shoulder.  Initially she was barely able to move the shoulder but has significantly improved her ROM and pain since that time with relative rest.  She is on light duty at work.  She has been on a steroid dose pack which was helpful.  Denies clicking and popping.   Red flags:  denies   Pain:  Are you having pain? 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  Hand Dominance: R  Patient Goals/Specific Activities: Reduce pain   OBJECTIVE:    DIAGNOSTIC FINDINGS:  No MRI  GENERAL OBSERVATION:  NA     SENSATION:  Light touch: Appears intact   PALPATION: Minimal TTP about the R shoulder  UPPER EXTREMITY AROM:  ROM Right (Eval) Left (Eval)  Shoulder flexion N N  Shoulder abduction n n  Shoulder internal rotation    Shoulder  external rotation    Functional IR n n  Functional ER n n  Shoulder extension    Elbow extension    Elbow flexion     (Blank rows = not tested, N = WNL, * = concordant pain with testing)  UPPER EXTREMITY MMT:  MMT Right (Eval) Left (Eval)  Shoulder flexion 4* 4+  Shoulder abduction (C5) 4* 4+  Shoulder ER 4* 4+  Shoulder IR 4* 4+  Middle trapezius    Lower trapezius    Shoulder extension    Grip strength    Cervical flexion (C1,C2)    Cervical S/B (C3)    Shoulder shrug (C4)    Elbow flexion (C6)    Elbow ext (C7)    Thumb ext (C8)    Finger abd (T1)    Grossly     (Blank rows = not tested, score listed is out of 5 possible points.  N = WNL, D = diminished, C = clear for gross weakness with myotome testing, * =  concordant pain with testing)   UPPER EXTREMITY PROM:  PROM Right (Eval) Left (Eval)  Shoulder flexion    Shoulder abduction    Shoulder internal rotation    Shoulder external rotation    Functional IR    Functional ER    Shoulder extension    Elbow extension    Elbow flexion     (Blank rows = not tested, N = WNL, * = concordant pain with testing)  SPECIAL TESTS:  R/C test cluster (+)  PATIENT SURVEYS:  FOTO 28 -> 56    TODAY'S TREATMENT:  Creating, reviewing, and completing below HEP   PATIENT EDUCATION:  POC, diagnosis, prognosis, HEP, and outcome measures.  Pt educated via explanation, demonstration, and handout (HEP).  Pt confirms understanding verbally.   HOME EXERCISE PROGRAM: Access Code: [email or web address] URL: [email or web address] Date: 10/13/2022 Prepared by: Alphonzo Severance  Exercises - Standing Isometric Shoulder External Rotation with Doorway  - 3 x daily - 7 x weekly - 1 sets - 10 reps - 5 hold - Seated Scapular Retraction  - 1 x daily - 7 x weekly - 2 sets - 10 reps - 5 hold  Treatment priorities   Eval        Progressive strengthening into OH movement and lifting                                          ASSESSMENT:  CLINICAL IMPRESSION: [name] is a 47 y.o. female who presents to clinic with signs and sxs consistent with R shoulder pain following fall.  Unable to rule out small tear, but given natural history significant pathology is unlikely.  Pt is showing significant reduction in pain and improvement in ROM with relative rest over the last several weeks.  Gave her a few light exercises for her to work on.  Will plan to see her back in about 2 weeks with likely D/C as long as she continues to progress.    OBJECTIVE IMPAIRMENTS: Pain, R shoulder strength  ACTIVITY LIMITATIONS: reaching, lifting, working  PERSONAL FACTORS: See medical history and pertinent history   REHAB POTENTIAL: Good  CLINICAL DECISION MAKING:  Stable/uncomplicated  EVALUATION COMPLEXITY: Low   GOALS:   SHORT TERM GOALS: Target date: 11/10/2022   [name] will be >75% HEP compliant to improve carryover between sessions and facilitate independent management of condition  Evaluation: ongoing  Goal status: INITIAL   LONG TERM GOALS: Target date: 12/08/2022   [name] will improve FOTO score to 56 as a proxy for functional improvement  Evaluation/Baseline: 28 Goal status: INITIAL    2.  [name] will self report >/= 50% decrease in pain from evaluation   Evaluation/Baseline: 5/10 max pain Goal status: INITIAL   3.  [name] will be able to return to work, not limited by pain  Evaluation/Baseline: limited Goal status: INITIAL   4.  [name] will report confidence in self management of condition at time of discharge with advanced HEP  Evaluation/Baseline: unable to self manage Goal status: INITIAL    PLAN: PT FREQUENCY: 1-2x/week  PT DURATION: 8 weeks  PLANNED INTERVENTIONS: Therapeutic exercises, Aquatic therapy, Therapeutic activity, Neuro Muscular re-education, Gait training, Patient/Family education, Joint mobilization, Dry Needling, Electrical stimulation, Spinal mobilization and/or manipulation, Moist heat, Taping, Vasopneumatic device, Ionotophoresis 4mg /ml Dexamethasone, and Manual therapy   Alphonzo Severance PT, DPT 10/13/2022, 5:00 PM

## 2022-10-28 ENCOUNTER — Ambulatory Visit: Payer: Medicaid Other

## 2022-11-02 ENCOUNTER — Ambulatory Visit: Payer: Medicaid Other | Attending: Orthopaedic Surgery | Admitting: Physical Therapy

## 2022-11-30 DIAGNOSIS — Z6841 Body Mass Index (BMI) 40.0 and over, adult: Secondary | ICD-10-CM | POA: Diagnosis not present

## 2022-11-30 DIAGNOSIS — Z0000 Encounter for general adult medical examination without abnormal findings: Secondary | ICD-10-CM | POA: Diagnosis not present

## 2022-12-05 ENCOUNTER — Encounter (HOSPITAL_COMMUNITY): Payer: Self-pay | Admitting: *Deleted

## 2022-12-05 ENCOUNTER — Other Ambulatory Visit: Payer: Self-pay

## 2022-12-05 ENCOUNTER — Ambulatory Visit (HOSPITAL_COMMUNITY)
Admission: EM | Admit: 2022-12-05 | Discharge: 2022-12-05 | Disposition: A | Discharge status: Home / Self Care | Payer: Medicaid Other | Attending: Family Medicine | Admitting: Family Medicine

## 2022-12-05 DIAGNOSIS — J029 Acute pharyngitis, unspecified: Secondary | ICD-10-CM | POA: Insufficient documentation

## 2022-12-05 DIAGNOSIS — Z1152 Encounter for screening for COVID-19: Secondary | ICD-10-CM | POA: Diagnosis not present

## 2022-12-05 DIAGNOSIS — R197 Diarrhea, unspecified: Secondary | ICD-10-CM | POA: Diagnosis not present

## 2022-12-05 DIAGNOSIS — B349 Viral infection, unspecified: Secondary | ICD-10-CM | POA: Insufficient documentation

## 2022-12-05 HISTORY — DX: Vitamin D deficiency, unspecified: E55.9

## 2022-12-05 HISTORY — DX: Prediabetes: R73.03

## 2022-12-05 LAB — POCT RAPID STREP A (OFFICE): Rapid Strep A Screen: NEGATIVE

## 2022-12-05 NOTE — ED Provider Notes (Signed)
[facility]    CSN: [id number] Arrival date & time: 12/05/22  1600      History   Chief Complaint Chief Complaint  Patient presents with   Sore Throat   Diarrhea    HPI [name] is a 47 y.o. female.   The history is provided by the patient. No language interpreter was used.  Sore Throat This is a new problem. Episode onset: Started today. The problem occurs constantly. The problem has not changed since onset.Associated symptoms include abdominal pain. Pertinent negatives include no chest pain, no headaches and no shortness of breath. Nothing aggravates the symptoms. Relieved by: Cold drink. She has tried nothing for the symptoms.  Diarrhea Diarrhea characteristics: Loose sometimes greenish stool. Severity:  Moderate Onset quality:  Gradual Number of episodes:  1 loose stool every other day Duration:  4 days Timing:  Intermittent Progression:  Worsening Relieved by:  Nothing Worsened by:  Nothing Ineffective treatments: Probiotic and OTC meds. Associated symptoms: abdominal pain   Associated symptoms: no headaches   Associated symptoms comment:  Kids had diarrhea a few days ago, but they feel better now. Was exposed to her son who tested positive for COVID. She does not have a lot of coughing. Abdominal pain:    Location:  RUQ   Quality: cramping and dull     Severity:  Moderate   Onset quality:  Gradual   Duration: Started today.   Progression:  Unchanged (Better now)   Past Medical History:  Diagnosis Date   ADD (attention deficit disorder) without hyperactivity    no meds   Depression    no meds   Headache(784.0)    tx w/OTC meds prn   Heartburn    MRSA (methicillin resistant staph aureus) culture positive    Obesity    Pre-diabetes    Vitamin D deficiency     Patient Active Problem List   Diagnosis Date Noted   Appendicitis 04/06/2013   Sterilization 06/11/2011   Oligohydramnios 04/10/2011    Past Surgical History:  Procedure  Laterality Date   LAPAROSCOPIC APPENDECTOMY N/A 04/06/2013   Procedure: APPENDECTOMY LAPAROSCOPIC;  Surgeon: Clovis Pu. Cornett, MD;  Location: [facility];  Service: General;  Laterality: N/A;   LAPAROSCOPIC TUBAL LIGATION  06/11/2011   Procedure: LAPAROSCOPIC TUBAL LIGATION;  Surgeon: Antionette Char, MD;  Location: [facility];  Service: Gynecology;  Laterality: N/A;   SVD     x 4 -3 epidurals and 1 spinal w/ SVD   TUBAL LIGATION     WISDOM TOOTH EXTRACTION      OB History     Gravida  4   Para  4   Term  4   Preterm      AB      Living  4      SAB      IAB      Ectopic      Multiple      Live Births  1            Home Medications    Prior to Admission medications   Medication Sig Start Date End Date Taking? Authorizing Provider  Multiple Vitamin (MULTIVITAMIN PO) Take by mouth.   Yes [provider]  Probiotic Product (PROBIOTIC PO) Take by mouth.   Yes [provider]  acetaminophen (TYLENOL) 500 MG tablet Take 1,000 mg by mouth every 6 (six) hours as needed for moderate pain.    [provider]  baclofen (LIORESAL) 10 MG  tablet Take 1 tablet (10 mg total) by mouth 3 (three) times daily as needed for muscle spasms. 09/24/22   Arthor Captain, PA-C  celecoxib (CELEBREX) 200 MG capsule Take 1 capsule (200 mg total) by mouth 2 (two) times daily. 09/24/22   Arthor Captain, PA-C  diclofenac (VOLTAREN) 75 MG EC tablet Take 1 tablet (75 mg total) by mouth 2 (two) times daily. 09/28/22   Tarry Kos, MD  methylPREDNISolone (MEDROL DOSEPAK) 4 MG TBPK tablet Use as directed 09/24/22   Arthor Captain, PA-C    Family History Family History  Problem Relation Age of Onset   Diabetes Mother    Hyperlipidemia Mother    Hypertension Mother    Cirrhosis Mother    Diabetes Father    Cancer Maternal Grandmother        breast   Cancer Maternal Aunt        thyroid    Social History Social History   Tobacco Use   Smoking status: Every Day     Current packs/day: 0.20    Average packs/day: 0.2 packs/day for 4.0 years (0.8 ttl pk-yrs)    Types: Cigarettes   Smokeless tobacco: Never   Tobacco comments:    3-4 cigarettes at night only  Vaping Use   Vaping status: Never Used  Substance Use Topics   Alcohol use: Yes    Comment: rarely   Drug use: Not Currently     Allergies   Patient has no known allergies.   Review of Systems Review of Systems  Respiratory:  Negative for shortness of breath.   Cardiovascular:  Negative for chest pain.  Gastrointestinal:  Positive for abdominal pain and diarrhea.  Neurological:  Negative for headaches.  All other systems reviewed and are negative.    Physical Exam Triage Vital Signs ED Triage Vitals  Encounter Vitals Group     BP 12/05/22 1621 122/76     Systolic BP Percentile --      Diastolic BP Percentile --      Pulse Rate 12/05/22 1621 96     Resp 12/05/22 1621 (!) 22     Temp 12/05/22 1621 98.1 F (36.7 C)     Temp src --      SpO2 12/05/22 1621 97 %     Weight --      Height --      Head Circumference --      Peak Flow --      Pain Score 12/05/22 1623 6     Pain Loc --      Pain Education --      Exclude from Growth Chart --    No data found.  Updated Vital Signs BP 122/76   Pulse 96   Temp 98.1 F (36.7 C)   Resp (!) 22   LMP 11/28/2022 (Approximate)   SpO2 97%   Visual Acuity Right Eye Distance:   Left Eye Distance:   Bilateral Distance:    Right Eye Near:   Left Eye Near:    Bilateral Near:     Physical Exam Vitals (Repeat RR: 18) and nursing note reviewed.  Constitutional:      General: She is not in acute distress.    Appearance: Normal appearance. She is not ill-appearing.  HENT:     Mouth/Throat:     Mouth: Mucous membranes are moist.     Pharynx: Oropharynx is clear. No oropharyngeal exudate or posterior oropharyngeal erythema.  Eyes:     Conjunctiva/sclera: Conjunctivae normal.  Pupils: Pupils are equal, round, and reactive to  light.  Cardiovascular:     Rate and Rhythm: Normal rate and regular rhythm.     Heart sounds: Normal heart sounds. No murmur heard. Pulmonary:     Effort: Pulmonary effort is normal. No respiratory distress.     Breath sounds: Normal breath sounds. No wheezing.  Abdominal:     General: Abdomen is flat. Bowel sounds are normal. There is no distension.     Palpations: Abdomen is soft. There is no mass.     Tenderness: There is no abdominal tenderness.  Neurological:     Mental Status: She is alert.      UC Treatments / Results  Labs (all labs ordered are listed, but only abnormal results are displayed) Labs Reviewed  SARS CORONAVIRUS 2 (TAT 6-24 HRS)  POCT RAPID STREP A (OFFICE)    EKG   Radiology No results found.  Procedures Procedures (including critical care time)  Medications Ordered in UC Medications - No data to display  Initial Impression / Assessment and Plan / UC Course  I have reviewed the triage vital signs and the nursing notes.  Pertinent labs & imaging results that were available during my care of the patient were reviewed by me and considered in my medical decision making (see chart for details).  Clinical Course as of 12/05/22 1655  Sun Dec 05, 2022  1653 Pharyngitis Likely viral Neg rapid strep test May use Tylenol prn pain [KE]  1654 COVID-19 exposure Test obtained We will contact her soon with her test result [KE]  1654 Diarrhea and abdominal pain - resolving Likely viral Keep well hydrated Monitor closely for improvement [KE]    Clinical Course User Index [KE] Doreene Eland, MD    Final Clinical Impressions(s) / UC Diagnoses   Final diagnoses:  Viral illness  Diarrhea, unspecified type  Sore throat     Discharge Instructions      It was nice seeing you. You likely have a viral illness. Your strep test is negative. We will call soon with your COVID test. Please keep well hydrated and rested. You may take Tylenol as needed  for pain and fever. Follow up with your PCP soon.     ED Prescriptions   None    PDMP not reviewed this encounter.   Doreene Eland, MD 12/05/22 1655

## 2022-12-05 NOTE — Discharge Instructions (Signed)
It was nice seeing you. You likely have a viral illness. Your strep test is negative. We will call soon with your COVID test. Please keep well hydrated and rested. You may take Tylenol as needed for pain and fever. Follow up with your PCP soon.

## 2022-12-05 NOTE — ED Triage Notes (Signed)
C/O diarrhea, nausea onset 2-3 days ago; today c/o sore throat. States son tested positive for Covid yesterday and daughter tested positive for strep 2 days ago. No known fevers.

## 2022-12-06 ENCOUNTER — Telehealth: Payer: Self-pay | Admitting: Family Medicine

## 2022-12-06 LAB — SARS CORONAVIRUS 2 (TAT 6-24 HRS): SARS Coronavirus 2: NEGATIVE

## 2022-12-06 NOTE — Telephone Encounter (Signed)
Covid 19 test result discussed.

## 2023-02-02 ENCOUNTER — Ambulatory Visit: Payer: Medicaid Other | Admitting: Orthopaedic Surgery

## 2023-02-02 ENCOUNTER — Encounter: Payer: Self-pay | Admitting: Orthopaedic Surgery

## 2023-02-02 DIAGNOSIS — M25511 Pain in right shoulder: Secondary | ICD-10-CM | POA: Diagnosis not present

## 2023-02-02 NOTE — Progress Notes (Signed)
Office Visit Note   Patient: [name]           Date of Birth: 24-Nov-1975           MRN: [id number] Visit Date: 02/02/2023              Requested by: No referring provider defined for this encounter. PCP: Patient, No Pcp Per   Assessment & Plan: Visit Diagnoses:  1. Acute pain of right shoulder     Plan: Impression is nearly resolved right shoulder pain.  At this point, patient can be released from our care.  She may advance with activity as tolerated.  Follow-up as needed.  Follow-Up Instructions: Return if symptoms worsen or fail to improve.   Orders:  No orders of the defined types were placed in this encounter.  No orders of the defined types were placed in this encounter.     Procedures: No procedures performed   Clinical Data: No additional findings.   Subjective: Chief Complaint  Patient presents with   Right Shoulder - Follow-up    HPI patient is a pleasant 47 year old female who comes in today for recheck of her right shoulder.  She sustained a mechanical fall at [facility] about 5 months ago.  She was seen in our office following the injury.  She was sent to physical therapy.  She is here today for follow-up.  She is doing much better.  She only notes occasional soreness which is relieved with Motrin.  She is ready to be released at this time.  Review of Systems as detailed in HPI.  All others reviewed and are negative.   Objective: Vital Signs: There were no vitals taken for this visit.  Physical Exam well-developed well-nourished female no acute distress.  Alert and oriented x 3.  Ortho Exam right shoulder exam: Full active and painless range of motion in all planes.  Full strength throughout.  She is neurovascularly intact distally.  Specialty Comments:  No specialty comments available.  Imaging: No new imaging   PMFS History: Patient Active Problem List   Diagnosis Date Noted   Appendicitis 04/06/2013   Encounter for sterilization  06/11/2011   Oligohydramnios 04/10/2011   Past Medical History:  Diagnosis Date   ADD (attention deficit disorder) without hyperactivity    no meds   Depression    no meds   Headache(784.0)    tx w/OTC meds prn   Heartburn    MRSA (methicillin resistant staph aureus) culture positive    Obesity    Pre-diabetes    Vitamin D deficiency     Family History  Problem Relation Age of Onset   Diabetes Mother    Hyperlipidemia Mother    Hypertension Mother    Cirrhosis Mother    Diabetes Father    Cancer Maternal Grandmother        breast   Cancer Maternal Aunt        thyroid    Past Surgical History:  Procedure Laterality Date   LAPAROSCOPIC APPENDECTOMY N/A 04/06/2013   Procedure: APPENDECTOMY LAPAROSCOPIC;  Surgeon: Clovis Pu. Cornett, MD;  Location: [facility];  Service: General;  Laterality: N/A;   LAPAROSCOPIC TUBAL LIGATION  06/11/2011   Procedure: LAPAROSCOPIC TUBAL LIGATION;  Surgeon: Antionette Char, MD;  Location: [facility];  Service: Gynecology;  Laterality: N/A;   SVD     x 4 -3 epidurals and 1 spinal w/ SVD   TUBAL LIGATION     WISDOM TOOTH EXTRACTION  Social History   Occupational History   Not on file  Tobacco Use   Smoking status: Every Day    Current packs/day: 0.20    Average packs/day: 0.2 packs/day for 4.0 years (0.8 ttl pk-yrs)    Types: Cigarettes   Smokeless tobacco: Never   Tobacco comments:    3-4 cigarettes at night only  Vaping Use   Vaping status: Never Used  Substance and Sexual Activity   Alcohol use: Yes    Comment: rarely   Drug use: Not Currently   Sexual activity: Yes    Birth control/protection: Surgical

## 2023-03-03 ENCOUNTER — Encounter (INDEPENDENT_AMBULATORY_CARE_PROVIDER_SITE_OTHER): Payer: Self-pay | Admitting: Primary Care

## 2023-03-03 ENCOUNTER — Ambulatory Visit (INDEPENDENT_AMBULATORY_CARE_PROVIDER_SITE_OTHER): Payer: Medicaid Other | Admitting: Primary Care

## 2023-03-03 VITALS — BP 119/75 | HR 88 | Resp 16 | Ht 68.0 in | Wt 327.8 lb

## 2023-03-03 DIAGNOSIS — M25552 Pain in left hip: Secondary | ICD-10-CM

## 2023-03-03 DIAGNOSIS — M25562 Pain in left knee: Secondary | ICD-10-CM

## 2023-03-03 DIAGNOSIS — Z1211 Encounter for screening for malignant neoplasm of colon: Secondary | ICD-10-CM

## 2023-03-03 DIAGNOSIS — Z1159 Encounter for screening for other viral diseases: Secondary | ICD-10-CM

## 2023-03-03 DIAGNOSIS — Z2821 Immunization not carried out because of patient refusal: Secondary | ICD-10-CM | POA: Diagnosis not present

## 2023-03-03 DIAGNOSIS — Z823 Family history of stroke: Secondary | ICD-10-CM

## 2023-03-03 DIAGNOSIS — G8929 Other chronic pain: Secondary | ICD-10-CM | POA: Diagnosis not present

## 2023-03-03 DIAGNOSIS — M25561 Pain in right knee: Secondary | ICD-10-CM | POA: Diagnosis not present

## 2023-03-03 DIAGNOSIS — L308 Other specified dermatitis: Secondary | ICD-10-CM | POA: Diagnosis not present

## 2023-03-03 MED ORDER — TRIAMCINOLONE ACETONIDE 0.1 % EX CREA: 1.0000 | TOPICAL_CREAM | Freq: Two times a day (BID) | CUTANEOUS | 0 refills | Status: DC

## 2023-03-03 NOTE — Progress Notes (Signed)
New Patient Office Visit  Subjective    Patient ID: [name] female  DOB: 12-Jun-1975  Age: 47 y.o. MRN: [id number]   CC:   HPI     Abdominal Pain    Additional comments: With Diarrhea Pt made chili on Tuesday         Rash    Additional comments: B/l hands and left arm Pt doesn't know if It's Psoriasis or Eczema Does burn         Depression    Additional comments: Lost both parents this year Sister lost her son  She has had a lot of deaths in the family this year         Knee Pain    Additional comments: B/l         Hip Pain    Additional comments: Left         Fall    Additional comments: Had a fall in June 2024       Last edited by Herbert Deaner, RMA on 03/03/2023  2:20 PM.      Abdominal Pain  Rash  Anxiety    Depression        Past medical history includes anxiety.   Knee Pain   Hip Pain   Fall Associated symptoms include abdominal pain.     No current outpatient medications on file prior to visit.   No current facility-administered medications on file prior to visit.     No Known Allergies  Past Medical History:  Diagnosis Date   ADD (attention deficit disorder) without hyperactivity    no meds   Depression    no meds   Headache(784.0)    tx w/OTC meds prn   Heartburn    MRSA (methicillin resistant staph aureus) culture positive    Obesity    Pre-diabetes    Vitamin D deficiency      Past Surgical History:  Procedure Laterality Date   LAPAROSCOPIC APPENDECTOMY N/A 04/06/2013   Procedure: APPENDECTOMY LAPAROSCOPIC;  Surgeon: Clovis Pu. Cornett, MD;  Location: [facility];  Service: General;  Laterality: N/A;   LAPAROSCOPIC TUBAL LIGATION  06/11/2011   Procedure: LAPAROSCOPIC TUBAL LIGATION;  Surgeon: Antionette Char, MD;  Location: [facility];  Service: Gynecology;  Laterality: N/A;   SVD     x 4 -3 epidurals and 1 spinal w/ SVD   TUBAL LIGATION     WISDOM TOOTH EXTRACTION       Family History  Problem  Relation Age of Onset   Diabetes Mother    Hyperlipidemia Mother    Hypertension Mother    Cirrhosis Mother    Diabetes Father    Cancer Maternal Grandmother        breast   Cancer Maternal Aunt        thyroid    Social History   Socioeconomic History   Marital status: Legally Separated    Spouse name: Not on file   Number of children: Not on file   Years of education: Not on file   Highest education level: Not on file  Occupational History   Not on file  Tobacco Use   Smoking status: Every Day    Current packs/day: 0.20    Average packs/day: 0.2 packs/day for 4.0 years (0.8 ttl pk-yrs)    Types: Cigarettes   Smokeless tobacco: Never   Tobacco comments:    3-4 cigarettes at night only  Vaping Use   Vaping status: Never Used  Substance and Sexual  Activity   Alcohol use: Yes    Comment: rarely   Drug use: Not Currently   Sexual activity: Yes    Birth control/protection: Surgical  Other Topics Concern   Not on file  Social History Narrative   Not on file   Social Drivers of Health   Financial Resource Strain: Not on file  Food Insecurity: Not on file  Transportation Needs: Not on file  Physical Activity: Not on file  Stress: Not on file  Social Connections: Not on file  Intimate Partner Violence: Not on file       Health Maintenance  Topic Date Due   Hepatitis C Screening  Never done   Pap with HPV screening  Never done   Colon Cancer Screening  Never done   COVID-19 Vaccine (1 - 2024-25 season) Never done   Flu Shot  06/20/2023*   DTaP/Tdap/Td vaccine (1 - Tdap) 03/02/2024*   HIV Screening  Completed   HPV Vaccine  Aged Out  *Topic was postponed. The date shown is not the original due date.    Objective    BP 119/75   Pulse 88   Resp 16   Ht 5\' 8"  (1.727 m)   Wt (!) 327 lb 12.8 oz (148.7 kg)   SpO2 96%   BMI 49.84 kg/m    Physical Exam Vitals reviewed.  Constitutional:      Appearance: She is obese.     Comments: morbid  HENT:      Head: Normocephalic.     Right Ear: Tympanic membrane, ear canal and external ear normal.     Left Ear: Tympanic membrane, ear canal and external ear normal.     Nose: Nose normal.     Mouth/Throat:     Mouth: Mucous membranes are moist.  Eyes:     Extraocular Movements: Extraocular movements intact.     Pupils: Pupils are equal, round, and reactive to light.  Cardiovascular:     Rate and Rhythm: Normal rate.  Pulmonary:     Effort: Pulmonary effort is normal.     Breath sounds: Normal breath sounds.  Abdominal:     General: Abdomen is protuberant. Bowel sounds are normal.     Palpations: Abdomen is soft.  Musculoskeletal:        General: Normal range of motion.     Cervical back: Normal range of motion.  Skin:    General: Skin is warm and dry.     Findings: Rash present.  Neurological:     Mental Status: She is alert and oriented to person, place, and time.  Psychiatric:        Mood and Affect: Mood normal.     Assessment & Plan:  Kashish was seen today for new patient (initial visit), abdominal pain, rash, anxiety, depression, knee pain, hip pain and fall.  Diagnoses and all orders for this visit:  Chronic pain of both knees 2/2 Pain of left hip -     Ambulatory referral to Orthopedic Surgery  Influenza vaccination declined  Tetanus, diphtheria, and acellular pertussis (Tdap) vaccination declined  Psoriasiform dermatitis     triamcinolone cream (KENALOG) 0.1 %  -     Ambulatory referral to Dermatology -     CBC with Differential/Platelet -     CMP14+EGFR  Encounter for HCV screening test for low risk patient -     HCV Ab w Reflex to Quant PCR  Colon cancer screening -     Ambulatory referral to Gastroenterology  Morbid obesity (HCC) -     Lipid panel  Family history of cerebrovascular accident (CVA) in father -     Lipid panel     Follow-up:  Return in about 2 months (around 05/04/2023) for medical conditions.  The above assessment and management  plan was discussed with the patient. The patient verbalized understanding of and has agreed to the management plan. Patient is aware to call the clinic if symptoms fail to improve or worsen. Patient is aware when to return to the clinic for a follow-up visit. Patient educated on when it is appropriate to go to the emergency department.   Gwinda Passe, NP-C

## 2023-03-29 ENCOUNTER — Encounter: Payer: Medicaid Other | Admitting: Orthopaedic Surgery

## 2023-03-31 ENCOUNTER — Emergency Department (HOSPITAL_BASED_OUTPATIENT_CLINIC_OR_DEPARTMENT_OTHER): Payer: Medicaid Other | Admitting: Radiology

## 2023-03-31 ENCOUNTER — Emergency Department (HOSPITAL_BASED_OUTPATIENT_CLINIC_OR_DEPARTMENT_OTHER)
Admission: EM | Admit: 2023-03-31 | Discharge: 2023-03-31 | Disposition: A | Discharge status: Home / Self Care | Payer: Medicaid Other | Attending: Emergency Medicine | Admitting: Emergency Medicine

## 2023-03-31 ENCOUNTER — Other Ambulatory Visit: Payer: Self-pay

## 2023-03-31 DIAGNOSIS — S31829A Unspecified open wound of left buttock, initial encounter: Secondary | ICD-10-CM | POA: Diagnosis not present

## 2023-03-31 DIAGNOSIS — W19XXXA Unspecified fall, initial encounter: Secondary | ICD-10-CM | POA: Insufficient documentation

## 2023-03-31 DIAGNOSIS — S3992XA Unspecified injury of lower back, initial encounter: Secondary | ICD-10-CM | POA: Diagnosis not present

## 2023-03-31 DIAGNOSIS — M545 Low back pain, unspecified: Secondary | ICD-10-CM | POA: Diagnosis not present

## 2023-03-31 MED ORDER — LIDOCAINE 5 % EX PTCH: 1.0000 | MEDICATED_PATCH | CUTANEOUS | 0 refills | Status: DC

## 2023-03-31 NOTE — ED Provider Notes (Signed)
 [place] EMERGENCY DEPARTMENT AT [facility] Provider Note   CSN: [id number] Arrival date & time: 03/31/23  1619     History  Chief Complaint  Patient presents with   [place]    [name] is a 48 y.o. female with past medical history of obesity, headaches, depression reporting to emergency room after fall.  Patient reports this happened 2 days ago she was sitting down on a rolling stool missed the corner of the stool and fell onto her bottom.  Has had pain over her tailbone since.  She is able to walk and sit however pain is worse when she is directly sitting on the area.  Has had improvement in symptoms with Tylenol  ibuprofen .  No other associated symptoms did not hit head during fall no loss of consciousness or confusion since event.   Fall       Home Medications Prior to Admission medications   Medication Sig Start Date End Date Taking? Authorizing Provider  triamcinolone  cream (KENALOG ) 0.1 % Apply 1 Application topically 2 (two) times daily. 03/03/23   Celestia Rosaline SQUIBB, NP      Allergies    Patient has no known allergies.    Review of Systems   Review of Systems  Musculoskeletal:  Positive for back pain.    Physical Exam Updated Vital Signs BP (!) 139/90 (BP Location: Right Arm)   Pulse 86   Temp (!) 97.5 F (36.4 C)   Resp 18   Ht 5' 8 (1.727 m)   Wt (!) 145.2 kg   SpO2 98%   BMI 48.66 kg/m  Physical Exam Vitals and nursing note reviewed.  Constitutional:      General: She is not in acute distress.    Appearance: She is not toxic-appearing.  HENT:     Head: Normocephalic and atraumatic.  Eyes:     General: No scleral icterus.    Conjunctiva/sclera: Conjunctivae normal.  Cardiovascular:     Rate and Rhythm: Normal rate and regular rhythm.     Pulses: Normal pulses.     Heart sounds: Normal heart sounds.  Pulmonary:     Effort: Pulmonary effort is normal. No respiratory distress.     Breath sounds: Normal breath sounds.  Abdominal:      General: Abdomen is flat. Bowel sounds are normal.     Palpations: Abdomen is soft.     Tenderness: There is no abdominal tenderness.  Musculoskeletal:     Comments: Point tenderness over coccyx and left gluteal region.  Area of bruising. No deformity.   Skin:    General: Skin is warm and dry.     Findings: No lesion.  Neurological:     General: No focal deficit present.     Mental Status: She is alert and oriented to person, place, and time. Mental status is at baseline.     ED Results / Procedures / Treatments   Labs (all labs ordered are listed, but only abnormal results are displayed) Labs Reviewed - No data to display  EKG None  Radiology DG Sacrum/Coccyx Result Date: 03/31/2023 CLINICAL DATA:  Fall and trauma to the sacral coccyx area. EXAM: SACRUM AND COCCYX - 2+ VIEW COMPARISON:  None Available. FINDINGS: There is no evidence of fracture or other focal bone lesions. IMPRESSION: Negative. Electronically Signed   By: Vanetta Chou M.D.   On: 03/31/2023 17:06    Procedures Procedures    Medications Ordered in ED Medications - No data to display  ED Course/  Medical Decision Making/ A&P                                 Medical Decision Making Amount and/or Complexity of Data Reviewed Radiology: ordered.  Risk Prescription drug management.   Jakelyn Squyres 48 y.o. presented today for fall. Working DDx that I considered at this time includes, but not limited to, subluxation, dislocation, contusion, fracture  R/o DDx: These diagnoses are less consistent than current impression due to findings on history of present illness, physical exam, labs/imaging findings.   Unique Tests and My Interpretation:  None  Imaging:  Imagine of the sacrum and coccyx with no acute abnormality  Problem List / ED Course / Critical interventions / Medication management  Patient reporting to emergency room after fall 2 days ago.  She has been able to ambulate but complaining of  pain over her tailbone. No other injuries, did not hit head, no LOC.  Imaging without any acute abnormality.  Patient neurovascularly intact.  Pain has been manageable with over-the-counter Tylenol  ibuprofen .  Given that patient is ambulatory and has not reassuring x-ray will treat with outpatient pain control and refer to primary care to ensure improvement of symptoms. Offered medications, patient declines.  Patients vitals assessed. Upon arrival patient is hemodynamically stable.  I have reviewed the patients home medicines and have made adjustments as needed    Consult: None   Plan:  F/u w/ PCP in 2-3d to ensure resolution of sx.  Patient was given return precautions. Patient stable for discharge at this time.  Patient educated on current sx/dx and verbalized understanding of plan. Return to ER w/ new or worsening sx.           Final Clinical Impression(s) / ED Diagnoses Final diagnoses:  Fall, initial encounter    Rx / DC Orders ED Discharge Orders     None         Mikiah Durall, Warren SAILOR, PA-C 03/31/23 1755    Mannie Pac T, DO 03/31/23 2253

## 2023-03-31 NOTE — ED Triage Notes (Signed)
 Pt POV ambulatory to triage reporting tailbone pain and bruising after falling 2 days ago. Painful to sit

## 2023-03-31 NOTE — Discharge Instructions (Addendum)
 Pain control with Tylenol 4 times a day, ibuprofen 3 times a day.  Ice and heat and lidocaine patches to pharmacy.  Follow-up with primary care to make sure your symptoms have improved  X ray without acute fracture.

## 2023-03-31 NOTE — ED Notes (Signed)
 RN reviewed discharge instructions with pt. Pt verbalized understanding and had no further questions. VSS upon discharge.

## 2023-04-07 ENCOUNTER — Encounter: Payer: Medicaid Other | Admitting: Orthopaedic Surgery

## 2023-04-12 ENCOUNTER — Encounter: Payer: Medicaid Other | Admitting: Orthopaedic Surgery

## 2023-04-19 ENCOUNTER — Encounter (INDEPENDENT_AMBULATORY_CARE_PROVIDER_SITE_OTHER): Payer: Self-pay | Admitting: Primary Care

## 2023-04-19 ENCOUNTER — Ambulatory Visit (INDEPENDENT_AMBULATORY_CARE_PROVIDER_SITE_OTHER): Payer: Medicaid Other | Admitting: Primary Care

## 2023-04-19 VITALS — BP 128/86 | HR 85 | Resp 16 | Ht 68.0 in | Wt 321.0 lb

## 2023-04-19 DIAGNOSIS — E66813 Obesity, class 3: Secondary | ICD-10-CM | POA: Diagnosis not present

## 2023-04-19 DIAGNOSIS — F418 Other specified anxiety disorders: Secondary | ICD-10-CM

## 2023-04-19 DIAGNOSIS — Z6841 Body Mass Index (BMI) 40.0 and over, adult: Secondary | ICD-10-CM | POA: Diagnosis not present

## 2023-04-19 DIAGNOSIS — W07XXXD Fall from chair, subsequent encounter: Secondary | ICD-10-CM | POA: Diagnosis not present

## 2023-04-19 NOTE — Progress Notes (Signed)
Subjective:   [name] is a 48 y.o. female presents for ED  follow up on 03/31/23 . She fell  on 03/29/23 she took her daughter to the dentist . Patient  went to sit down on a stool with wheels and the stool roll away and down she went. Today the bruise remains and sore . Pain is better prior too unable to sit or lay down comfortably. Left buttock 4/10 daily increases with sitting to long.   Patient has a high PHQ score doesn't want treatment medication - this time of the year she is remembering her parents being deceased and not here for the holidays. Agreed to talk with LCSW Past Medical History:  Diagnosis Date   ADD (attention deficit disorder) without hyperactivity    no meds   Depression    no meds   Headache(784.0)    tx w/OTC meds prn   Heartburn    MRSA (methicillin resistant staph aureus) culture positive    Obesity    Pre-diabetes    Vitamin D deficiency      No Known Allergies  Current Outpatient Medications on File Prior to Visit  Medication Sig Dispense Refill   WEGOVY 0.25 MG/0.5ML SOAJ Inject 0.25 mg into the skin once a week.     lidocaine (LIDODERM) 5 % Place 1 patch onto the skin daily. Remove & Discard patch within 12 hours or as directed by MD 30 patch 0   triamcinolone cream (KENALOG) 0.1 % Apply 1 Application topically 2 (two) times daily. 453 g 0   No current facility-administered medications on file prior to visit.    Review of System:  Comprehensive ROS Pertinent positive and negative noted in HPI   Objective:  BP 128/86   Pulse 85   Resp 16   Ht 5\' 8"  (1.727 m)   Wt (!) 321 lb (145.6 kg)   SpO2 97%   BMI 48.81 kg/m   Filed Weights   04/19/23 1013  Weight: (!) 321 lb (145.6 kg)    Physical Exam Vitals reviewed.  Constitutional:      Appearance: She is obese.     Comments: morbid  HENT:     Head: Normocephalic.     Right Ear: Tympanic membrane and external ear normal.     Left Ear: Tympanic membrane and external ear normal.      Nose: Nose normal.  Eyes:     Extraocular Movements: Extraocular movements intact.     Pupils: Pupils are equal, round, and reactive to light.  Cardiovascular:     Rate and Rhythm: Normal rate and regular rhythm.  Pulmonary:     Effort: Pulmonary effort is normal.     Breath sounds: Normal breath sounds.  Abdominal:     General: Bowel sounds are normal. There is distension.     Palpations: Abdomen is soft.  Musculoskeletal:        General: Normal range of motion.     Cervical back: Normal range of motion and neck supple.  Skin:    General: Skin is warm and dry.  Neurological:     Mental Status: She is oriented to person, place, and time.  Psychiatric:        Mood and Affect: Mood normal.        Behavior: Behavior normal.        Thought Content: Thought content normal.        Judgment: Judgment normal.      Assessment:  [name] was  seen today for hospitalization follow-up.  Diagnoses and all orders for this visit:  Fall from chair, subsequent encounter  Initial picture    Today picture   Depression with anxiety Flowsheet Row Office Visit from 04/19/2023 in [facility]  PHQ-9 Total Score 15      See HPI refer to LCSW   Morbid obesity [facility])  She has been prescribed wegovy by previous PCP she is on .25  requesting refill. Lost 6 pounds this month.  Denies -Suicidal thoughts, plans, or attempt . She has been nauseated the first 2 days after injection and has been having diarrhea intermittently and loss of appetite . Discussed risk v/s benefits . Also, has not had her menstrual cycle since starting medication prior to regular menstruation.   This note has been created with Education officer, environmental. Any transcriptional errors are unintentional.   Return in about 4 weeks (around 05/17/2023) for medical conditions.  Grayce Sessions, NP 04/19/2023, 10:58 AM

## 2023-04-19 NOTE — Patient Instructions (Addendum)
Type Date User Summary Attachment  Provider Comments 03/08/2023 10:16 PM Dionne Bucy [facility] Referral -  Note: Sent Referral to [facility]  Ph. # [phone] address [address] [place], [place] [address] ([place]). They will contact the patient to schedule in [facility] or [place]    Semaglutide Injection (Weight Management) What is this medication? SEMAGLUTIDE (SEM a GLOO tide) promotes weight loss. It may also be used to maintain weight loss.  It works by decreasing appetite. It can be used to lower the risk of heart attack and stroke in people affected by excess weight. Changes to diet and exercise are often combined with this medication. This medicine may be used for other purposes; ask your health care provider or pharmacist if you have questions. COMMON BRAND NAME(S): UEAVWU What should I tell my care team before I take this medication? They need to know if you have any of these conditions: Diabetes Eye disease caused by diabetes Gallbladder disease Have or have had depression Have or have had pancreatitis Having surgery Kidney disease Personal or family history of MEN 2, a condition that causes endocrine gland tumors Personal or family history of thyroid cancer Stomach or intestine problems, such as problems digesting food Suicidal thoughts, plans, or attempt An unusual or allergic reaction to semaglutide, other medications, foods, dyes, or preservatives Pregnant or trying to get pregnant Breastfeeding How should I use this medication? This medication is injected under the skin. You will be taught how to prepare and give it. Take it as directed on the prescription label. It is given once every week (every 7 days). Keep taking it unless your care team tells you to stop. It is important that you put your used needles and pens in a special sharps container. Do not put them in a trash can. If you do not have a sharps container, call your  pharmacist or care team to get one. A special MedGuide will be given to you by the pharmacist with each prescription and refill. Be sure to read this information carefully each time. This medication comes with INSTRUCTIONS FOR USE. Ask your pharmacist for directions on how to use this medication. Read the information carefully. Talk to your pharmacist or care team if you have questions. Talk to your care team about the use of this medication in children. While it may be prescribed for children as young as 12 years for selected conditions, precautions do apply. Overdosage: If you think you have taken too much of this medicine contact a poison control center or emergency room at once. NOTE: This medicine is only for you. Do not share this medicine with others. What if I miss a dose? If you miss a dose and the next scheduled dose is more than 2 days away, take the missed dose as soon as possible. If you miss a dose and the next scheduled dose is less than 2 days away, do not take the missed dose. Take the next dose at your regular time. Do not take double or extra doses. If you miss your dose for 2 weeks or more, take the next dose at your regular time or call your care team to talk about how to restart this medication. What may interact with this medication? Insulin and other medications for diabetes This list may not describe all possible interactions. Give your health care provider a list of all the medicines, herbs, non-prescription drugs, or dietary supplements you use. Also tell them if  you smoke, drink alcohol, or use illegal drugs. Some items may interact with your medicine. What should I watch for while using this medication? Visit your care team for regular checks on your progress. Tell your care team if your condition does not start to get better or if it gets worse. Tell your care team if you are taking medication to treat diabetes, such as insulin or glipizide. This may increase your risk of  low blood sugar. Know the symptoms of low blood sugar and how to treat it. Talk to your care team about your risk of cancer. You may be more at risk for certain types of cancer if you take this medication. Talk to your care team right away if you have a lump or swelling in your neck, hoarseness that does not go away, trouble swallowing, shortness of breath, or trouble breathing. Make sure you stay hydrated while taking this medication. Drink water often. Eat fruits and veggies that have a high water content. Drink more water when it is hot or you are active. Talk to your care team right away if you have fever, infection, vomiting, diarrhea, or if you sweat a lot while taking this medication. The loss of too much body fluid may make it dangerous for you to take this medication. If you are going to need surgery or a procedure, tell your care team that you are taking this medication. What side effects may I notice from receiving this medication? Side effects that you should report to your care team as soon as possible: Allergic reactions--skin rash, itching, hives, swelling of the face, lips, tongue, or throat Change in vision Dehydration--increased thirst, dry mouth, feeling faint or lightheaded, headache, dark yellow or brown urine Gallbladder problems--severe stomach pain, nausea, vomiting, fever Heart palpitations--rapid, pounding, or irregular heartbeat Kidney injury--decrease in the amount of urine, swelling of the ankles, hands, or feet Pancreatitis--severe stomach pain that spreads to your back or gets worse after eating or when touched, fever, nausea, vomiting Thoughts of suicide or self-harm, worsening mood, feelings of depression Thyroid cancer--new mass or lump in the neck, pain or trouble swallowing, trouble breathing, hoarseness Side effects that usually do not require medical attention (report these to your care team if they continue or are bothersome): Diarrhea Loss of  appetite Nausea Upset stomach This list may not describe all possible side effects. Call your doctor for medical advice about side effects. You may report side effects to FDA at 1-800-FDA-1088. Where should I keep my medication? Keep out of the reach of children and pets. Refrigeration (preferred): Store in the refrigerator. Do not freeze. Keep this medication in the original container until you are ready to take it. Get rid of any unused medication after the expiration date. Room temperature: If needed, prior to cap removal, the pen can be stored at room temperature for up to 28 days. Protect from light. If it is stored at room temperature, get rid of any unused medication after 28 days or after it expires, whichever is first. It is important to get rid of the medication as soon as you no longer need it or it is expired. You can do this in two ways: Take the medication to a medication take-back program. Check with your pharmacy or law enforcement to find a location. If you cannot return the medication, follow the directions in the MedGuide. NOTE: This sheet is a summary. It may not cover all possible information. If you have questions about this medicine, talk to your  doctor, pharmacist, or health care provider.  2024 Elsevier/Gold Standard (2023-02-18 00:00:00)

## 2023-04-20 ENCOUNTER — Telehealth (INDEPENDENT_AMBULATORY_CARE_PROVIDER_SITE_OTHER): Payer: Self-pay | Admitting: Licensed Clinical Social Worker

## 2023-04-20 NOTE — Telephone Encounter (Signed)
LCSWA called patient today to introduce herself and to assess patients' mental health needs. Patient did answer the phone to speak with her about what may be causing her depression and anxiety. Pt stated that she is dealing with some grief from losing her parents this past year and is having a hard time coping. LCSWA was able to make an appointment for the client on 02/06 to speak about depression coping skills and how to best utilize her supports.

## 2023-04-27 ENCOUNTER — Telehealth (INDEPENDENT_AMBULATORY_CARE_PROVIDER_SITE_OTHER): Payer: Self-pay

## 2023-04-27 NOTE — Telephone Encounter (Signed)
 Copied from CRM [id number]. Topic: Appointments - Scheduling Inquiry for Clinic >> Apr 27, 2023  9:31 AM Alfonso ORN wrote: Reason for CRM: Patient call to reschedule her appointment with Charmaine Hurst  on 04/28/23 at 10:00am patient need to cancel the appoitment  Please call patient back to reschedule

## 2023-04-27 NOTE — Telephone Encounter (Signed)
 Copied from CRM [id number]. Topic: Clinical - Medication Question >> Apr 27, 2023 12:37 PM Elle L wrote: Reason for CRM: The patient was following up on her request for a medication to replace her WeGovy  prescription as she is out of it as of last Thursday. Her call back number is [phone].

## 2023-04-27 NOTE — Telephone Encounter (Signed)
 Will forward to provider in regards to medication question

## 2023-04-28 ENCOUNTER — Other Ambulatory Visit (INDEPENDENT_AMBULATORY_CARE_PROVIDER_SITE_OTHER): Payer: Self-pay | Admitting: Primary Care

## 2023-04-28 ENCOUNTER — Encounter (INDEPENDENT_AMBULATORY_CARE_PROVIDER_SITE_OTHER): Payer: Self-pay | Admitting: Primary Care

## 2023-04-28 ENCOUNTER — Institutional Professional Consult (permissible substitution) (INDEPENDENT_AMBULATORY_CARE_PROVIDER_SITE_OTHER): Payer: Medicaid Other | Admitting: Licensed Clinical Social Worker

## 2023-04-28 MED ORDER — WEGOVY 0.25 MG/0.5ML ~~LOC~~ SOAJ: 0.2500 mg | SUBCUTANEOUS | 1 refills | Status: DC

## 2023-04-29 ENCOUNTER — Ambulatory Visit (INDEPENDENT_AMBULATORY_CARE_PROVIDER_SITE_OTHER): Payer: Self-pay | Admitting: Primary Care

## 2023-04-29 NOTE — Telephone Encounter (Signed)
 Pt is requesting an oral medication instead of Wegovy  as it makes her nauseous. Pt states she discussed this with Edwards NP and was told she would get a call back. Pt has not been called back with an update.  Reason for Disposition  [1] Caller has NON-URGENT medicine question about med that PCP prescribed AND [2] triager unable to answer question  Answer Assessment - Initial Assessment Questions 1. REASON FOR CALL or QUESTION: What is your reason for calling today? or How can I best help you? or What question do you have that I can help answer?     Medication question  Protocols used: Information Only Call - No Triage-A-AH, Medication Refill and Renewal Call-A-AH

## 2023-04-29 NOTE — Telephone Encounter (Signed)
 This RN attempted to contact patient for triage. No answer, voicemail left requesting return call to clinic.   Copied from CRM [id number]. Topic: Clinical - Medication Question >> Apr 29, 2023 11:35 AM Myrick T wrote: Reason for CRM: patient called stated she thought provider was going to change her meds to something other than Wegovy  as it makes her nauseous. Please f/u with patient for an alternate

## 2023-04-29 NOTE — Telephone Encounter (Signed)
 Noted.

## 2023-05-13 DIAGNOSIS — L219 Seborrheic dermatitis, unspecified: Secondary | ICD-10-CM | POA: Diagnosis not present

## 2023-05-13 DIAGNOSIS — L403 Pustulosis palmaris et plantaris: Secondary | ICD-10-CM | POA: Diagnosis not present

## 2023-05-13 DIAGNOSIS — L309 Dermatitis, unspecified: Secondary | ICD-10-CM | POA: Diagnosis not present

## 2023-05-14 DIAGNOSIS — L403 Pustulosis palmaris et plantaris: Secondary | ICD-10-CM | POA: Diagnosis not present

## 2023-05-24 ENCOUNTER — Encounter (INDEPENDENT_AMBULATORY_CARE_PROVIDER_SITE_OTHER): Payer: Self-pay

## 2023-05-24 ENCOUNTER — Ambulatory Visit (INDEPENDENT_AMBULATORY_CARE_PROVIDER_SITE_OTHER): Payer: Medicaid Other

## 2023-05-31 ENCOUNTER — Ambulatory Visit (INDEPENDENT_AMBULATORY_CARE_PROVIDER_SITE_OTHER): Admitting: Primary Care

## 2023-05-31 ENCOUNTER — Encounter (INDEPENDENT_AMBULATORY_CARE_PROVIDER_SITE_OTHER): Payer: Self-pay | Admitting: Primary Care

## 2023-05-31 DIAGNOSIS — Z79899 Other long term (current) drug therapy: Secondary | ICD-10-CM

## 2023-05-31 NOTE — Progress Notes (Signed)
           [facility]   Davene Jobin, is a 48 y.o. female presents for weight loss. Patient states they have better variety, improved meal pattern, less frequent dining out, decreased fat intake, and adequate fluid intake (at least 6 cups of fluid per day). Plan on prescribing phentermine after review labs pending. She had dizzy today with change of movement - self resolved. BP Readings from Last 3 Encounters:  05/31/23 114/78  04/19/23 128/86  03/31/23 (!) 139/90    Wt Readings from Last 3 Encounters:  05/31/23 (!) 321 lb (145.6 kg)  04/19/23 (!) 321 lb (145.6 kg)  03/31/23 (!) 320 lb (145.2 kg)   Medications: Current Outpatient Medications on File Prior to Visit  Medication Sig Dispense Refill   triamcinolone cream (KENALOG) 0.1 % Apply 1 Application topically 2 (two) times daily. 453 g 0   lidocaine (LIDODERM) 5 % Place 1 patch onto the skin daily. Remove & Discard patch within 12 hours or as directed by MD (Patient not taking: Reported on 05/31/2023) 30 patch 0   No current facility-administered medications on file prior to visit.    ROS:   Denies any headaches, blurred vision, fatigue, shortness of breath, chest pain, abdominal pain, abnormal vaginal discharge/itching/odor/irritation, problems with periods, bowel movements, urination, or intercourse unless otherwise stated above.   Physical exam: General: Vital signs reviewed.  Patient is well-developed and well-nourished, morbid obese in no acute distress and cooperative with exam. Head: Normocephalic and atraumatic. Eyes: EOMI, conjunctivae normal, no scleral icterus. Neck: Supple, trachea midline, normal ROM, no JVD, masses, thyromegaly, or carotid bruit present. Cardiovascular: RRR, S1 normal, S2 normal, no murmurs, gallops, or rubs. Pulmonary/Chest: Clear to auscultation bilaterally, no wheezes, rales, or rhonchi. Abdominal: Soft, non-tender, non-distended, BS +, no masses, organomegaly, or guarding  present. Musculoskeletal: No joint deformities, erythema, or stiffness, ROM full and nontender. Extremities: No lower extremity edema bilaterally,  pulses symmetric and intact bilaterally. No cyanosis or clubbing. Neurological: A&O x3, Strength is normal Skin: Warm, dry and intact. No rashes or erythema. Psychiatric: Normal mood and affect. speech and behavior is normal. Cognition and memory are normal.    Vitals:   05/31/23 1011  BP: 114/78  Pulse: 76  Temp: 97.8 F (36.6 C)  SpO2: 97%   Assessment and Plan:  Diagnoses and all orders for this visit:  High risk medication use -     EKG 12-Lead- normal   Morbid obesity (HCC) Obesity with co morbid conditions. General weight loss/lifestyle modification strategies discussed (elicit support from others; identify saboteurs; non-food rewards, etc). Informal exercise measures discussed, e.g. taking stairs instead of elevator. Regular aerobic exercise program discussed. Medication: phentermine. Sammie was seen today for weight management screening and dizziness.  This note has been created with Education officer, environmental. Any transcriptional errors are unintentional.   Grayce Sessions, NP 05/31/2023, 10:51 AM

## 2023-06-30 ENCOUNTER — Encounter: Payer: Self-pay | Admitting: Gastroenterology

## 2023-06-30 ENCOUNTER — Ambulatory Visit (INDEPENDENT_AMBULATORY_CARE_PROVIDER_SITE_OTHER): Admitting: Primary Care

## 2023-07-01 DIAGNOSIS — L403 Pustulosis palmaris et plantaris: Secondary | ICD-10-CM | POA: Diagnosis not present

## 2023-07-04 ENCOUNTER — Other Ambulatory Visit: Payer: Self-pay

## 2023-07-07 ENCOUNTER — Telehealth (INDEPENDENT_AMBULATORY_CARE_PROVIDER_SITE_OTHER): Payer: Self-pay | Admitting: Primary Care

## 2023-07-07 NOTE — Telephone Encounter (Signed)
 Called pt to confirm appt. Pt will be present.

## 2023-07-13 ENCOUNTER — Telehealth (INDEPENDENT_AMBULATORY_CARE_PROVIDER_SITE_OTHER): Payer: Self-pay | Admitting: Primary Care

## 2023-07-13 NOTE — Telephone Encounter (Signed)
 Called tp to confirm appt. Pt will be present

## 2023-07-14 ENCOUNTER — Encounter (INDEPENDENT_AMBULATORY_CARE_PROVIDER_SITE_OTHER): Payer: Self-pay | Admitting: Primary Care

## 2023-07-14 ENCOUNTER — Ambulatory Visit (INDEPENDENT_AMBULATORY_CARE_PROVIDER_SITE_OTHER): Admitting: Primary Care

## 2023-07-14 VITALS — BP 107/71 | HR 94 | Resp 16 | Wt 325.8 lb

## 2023-07-14 DIAGNOSIS — Z6841 Body Mass Index (BMI) 40.0 and over, adult: Secondary | ICD-10-CM | POA: Diagnosis not present

## 2023-07-14 DIAGNOSIS — Z7689 Persons encountering health services in other specified circumstances: Secondary | ICD-10-CM | POA: Diagnosis not present

## 2023-07-14 MED ORDER — PHENTERMINE HCL 37.5 MG PO CAPS: 37.5000 mg | ORAL_CAPSULE | ORAL | 1 refills | Status: DC

## 2023-07-14 NOTE — Progress Notes (Signed)
           [facility]   [name], is a 48 y.o. female presents for weight management.  We have previously tried Wegovy  which she liked and was losing weight but was having nausea.  She was concerned about her.  Has been irregular no longer pr.  Probable.  Last visit did a EKG and discussed starting phentermine  and some of the side effects.  Heart palpitations, anxiety insomnia and elevated blood pressure she is aware if any of these problems occur she will stop the medication and inform PCP.   BP Readings from Last 3 Encounters:  07/14/23 107/71  05/31/23 114/78  04/19/23 128/86    Wt Readings from Last 3 Encounters:  07/14/23 (!) 325 lb 12.8 oz (147.8 kg)  05/31/23 (!) 321 lb (145.6 kg)  04/19/23 (!) 321 lb (145.6 kg)   Medications: Current Outpatient Medications on File Prior to Visit  Medication Sig Dispense Refill   lidocaine  (LIDODERM ) 5 % Place 1 patch onto the skin daily. Remove & Discard patch within 12 hours or as directed by MD (Patient not taking: Reported on 05/31/2023) 30 patch 0   triamcinolone  cream (KENALOG ) 0.1 % Apply 1 Application topically 2 (two) times daily. 453 g 0   No current facility-administered medications on file prior to visit.    ROS:   Denies any headaches, blurred vision, fatigue, shortness of breath, chest pain, abdominal pain, abnormal vaginal discharge/itching/odor/irritation, problems with periods, bowel movements, urination, or intercourse unless otherwise stated above.  Physical exam: General: No apparent distress. Eyes: Extraocular eye movements intact, pupils equal and round. Neck: Supple, trachea midline. Thyroid : No enlargement, mobile without fixation, no tenderness. Cardiovascular: Regular rhythm and rate, no murmur, normal radial pulses. Respiratory: Normal respiratory effort, clear to auscultation. Gastrointestinal: Normal pitch active bowel sounds, nontender abdomen without distention or appreciable  hepatomegaly. Musculoskeletal: Normal muscle tone, no tenderness on palpation of tibia, no excessive thoracic kyphosis. Skin: Appropriate warmth, no visible rash.  Vitals:   07/14/23 1438  BP: 107/71  Pulse: 94  Resp: 16  SpO2: 99%    Assessment and Plan: Obesity with co morbid conditions.  [name] was seen today for weight management screening.  Diagnoses and all orders for this visit:  Encounter for weight management General weight loss/lifestyle modification strategies discussed (elicit support from others; identify saboteurs; non-food rewards, etc). Diet interventions: qualitative changes (increase low-fat,  high-fiber foods). Regular aerobic exercise program discussed. Medication: phentermine .  Other orders -     phentermine  37.5 MG capsule; Take 1 capsule (37.5 mg total) by mouth every morning.  This note has been created with Education officer, environmental. Any transcriptional errors are unintentional.   Marius Siemens, NP 07/14/2023, 2:43 PM

## 2023-07-15 DIAGNOSIS — L403 Pustulosis palmaris et plantaris: Secondary | ICD-10-CM | POA: Diagnosis not present

## 2023-08-01 ENCOUNTER — Encounter

## 2023-08-17 ENCOUNTER — Telehealth (INDEPENDENT_AMBULATORY_CARE_PROVIDER_SITE_OTHER): Payer: Self-pay | Admitting: Primary Care

## 2023-08-17 NOTE — Telephone Encounter (Signed)
 Spoke to pt about upcoming appt.. Will be present

## 2023-08-18 ENCOUNTER — Encounter (INDEPENDENT_AMBULATORY_CARE_PROVIDER_SITE_OTHER): Payer: Self-pay | Admitting: Primary Care

## 2023-08-18 ENCOUNTER — Ambulatory Visit (INDEPENDENT_AMBULATORY_CARE_PROVIDER_SITE_OTHER): Admitting: Primary Care

## 2023-08-18 VITALS — BP 123/88 | HR 93 | Resp 98 | Wt 327.0 lb

## 2023-08-18 DIAGNOSIS — Z2821 Immunization not carried out because of patient refusal: Secondary | ICD-10-CM | POA: Diagnosis not present

## 2023-08-18 DIAGNOSIS — Z7689 Persons encountering health services in other specified circumstances: Secondary | ICD-10-CM

## 2023-08-18 DIAGNOSIS — G4733 Obstructive sleep apnea (adult) (pediatric): Secondary | ICD-10-CM

## 2023-08-18 DIAGNOSIS — H6122 Impacted cerumen, left ear: Secondary | ICD-10-CM

## 2023-08-18 DIAGNOSIS — L308 Other specified dermatitis: Secondary | ICD-10-CM

## 2023-08-18 MED ORDER — TIRZEPATIDE 2.5 MG/0.5ML ~~LOC~~ SOAJ: 2.5000 mg | SUBCUTANEOUS | 1 refills | Status: DC

## 2023-08-18 NOTE — Progress Notes (Signed)
           [facility]   [name], is a 48 y.o. morbid obese female presents for a follow up after starting on phentermine  for weight loss she was only able to take the medication for 2 weeks before she started having heart palpitations.  This was discussed prior to the prescription and she stopped taking the medication as instructed. She would like to try a different medication for weight loss. Patient presents with possible obstructive sleep apnea. Patent has a several years history of symptoms of daytime fatigue, morning fatigue, and morning headache. Patient generally gets 2 or 3 hours of sleep per night, and states they generally have difficulty falling asleep, nightime awakenings, and difficulty falling back asleep if awakened. Snoring of severe severity is present. Apneic episodes is present. Nasal obstruction is not present.  Patient has not had tonsillectomy.   BP Readings from Last 3 Encounters:  08/18/23 123/88  07/14/23 107/71  05/31/23 114/78   Wt Readings from Last 3 Encounters:  08/18/23 (!) 327 lb (148.3 kg)  07/14/23 (!) 325 lb 12.8 oz (147.8 kg)  05/31/23 (!) 321 lb (145.6 kg)   Medications: Current Outpatient Medications on File Prior to Visit  Medication Sig Dispense Refill   triamcinolone  cream (KENALOG ) 0.1 % Apply 1 Application topically 2 (two) times daily. 453 g 0   No current facility-administered medications on file prior to visit.   ROS:   Denies any headaches, blurred vision, fatigue, shortness of breath, chest pain, abdominal pain, abnormal vaginal discharge/itching/odor/irritation, problems with periods, bowel movements, urination, or intercourse unless otherwise stated above.  Physical exam: Vitals:   08/18/23 1423  BP: 123/88  Pulse: 93  Resp: (!) 98  General: No apparent distress.morbid obese Eyes: Extraocular eye movements intact, pupils equal and round. Left ear cerumen impaction  Neck: Supple,thick , trachea  midline. Thyroid : No enlargement, mobile without fixation, no tenderness. Cardiovascular: Regular rhythm and rate, no murmur, normal radial pulses. Respiratory: Normal respiratory effort, clear to auscultation. Gastrointestinal: Normal pitch active bowel sounds, nontender abdomen without distention or appreciable hepatomegaly. Musculoskeletal: Normal muscle tone, no tenderness on palpation of tibia, no excessive thoracic kyphosis. Skin: Appropriate warmth, no visible rash. Mental status: Alert, conversant, speech clear, thought logical, appropriate mood and affect, no hallucinations or delusions evident. Hematologic/lymphatic: No cervical adenopathy, no visible ecchymoses.   Assessment and Plan: [name] was seen today for weight check.  Diagnoses and all orders for this visit:  Pneumococcal vaccination declined  Morbid obesity (HCC) 2/2 Encounter for weight management Obesity with co morbid conditions.  General weight loss/lifestyle modification strategies discussed (elicit support from others; identify saboteurs; non-food rewards, etc). -     tirzepatide  (MOUNJARO ) 2.5 MG/0.5ML Pen; Inject 2.5 mg into the skin once a week.  OSA (obstructive sleep apnea) Ambulatory referral to Pulmonology   Hearing loss of left ear due to cerumen impaction  Rtn for ear irrigation   Psoriasiform dermatitis  Address on f/u visit   This note has been created with Education officer, environmental. Any transcriptional errors are unintentional.   Marius Siemens, NP 08/18/2023, 2:54 PM

## 2023-08-18 NOTE — Patient Instructions (Signed)
 Tirzepatide Injection (Weight Management) What is this medication? TIRZEPATIDE (tir ZEP a tide) promotes weight loss. It may also be used to maintain weight loss.  It works by decreasing appetite. Changes to diet and exercise are often combined with this medication. This medicine may be used for other purposes; ask your health care provider or pharmacist if you have questions. COMMON BRAND NAME(S): Zepbound What should I tell my care team before I take this medication? They need to know if you have any of these conditions: Diabetes Eye disease caused by diabetes Gallbladder disease Have or have had depression Have or have had pancreatitis Having surgery Kidney disease Personal or family history of MEN 2, a condition that causes endocrine gland tumors Personal or family history of thyroid cancer Stomach or intestine problems, such as problems digesting food Suicidal thoughts, plans, or attempt An unusual or allergic reaction to tirzepatide, other medications, foods, dyes, or preservatives Pregnant or trying to get pregnant Breastfeeding How should I use this medication? This medication is injected under the skin. You will be taught how to prepare and give it. Take it as directed on the prescription label. Keep taking it unless your care team tells you to stop. It is important that you put your used needles and syringes in a special sharps container. Do not put them in a trash can. If you do not have a sharps container, call your pharmacist or care team to get one. A special MedGuide will be given to you by the pharmacist with each prescription and refill. Be sure to read this information carefully each time. This medication comes with INSTRUCTIONS FOR USE. Ask your pharmacist for directions on how to use this medication. Read the information carefully. Talk to your pharmacist or care team if you have questions. Talk to your care team about the use of this medication in children. Special care  may be needed. Overdosage: If you think you have taken too much of this medicine contact a poison control center or emergency room at once. NOTE: This medicine is only for you. Do not share this medicine with others. What if I miss a dose? If you miss a dose, take it as soon as you can unless it is more than 4 days (96 hours) late. If it is more than 4 days late, skip the missed dose. Take the next dose at the normal time. Do not take 2 doses within 3 days (72 hours) of each other. What may interact with this medication? Certain medications for diabetes, such as insulin, glyburide, glipizide This medication may affect how other medications work. Talk with your care team about all of the medications you take. They may suggest changes to your treatment plan to lower the risk of side effects and to make sure your medications work as intended. This list may not describe all possible interactions. Give your health care provider a list of all the medicines, herbs, non-prescription drugs, or dietary supplements you use. Also tell them if you smoke, drink alcohol, or use illegal drugs. Some items may interact with your medicine. What should I watch for while using this medication? Visit your care team for regular checks on your progress. Tell your care team if your condition does not start to get better or if it gets worse. Tell your care team if you are taking medication to treat diabetes, such as insulin or glipizide. This may increase your risk of low blood sugar. Know the symptoms of low blood sugar and how to  treat it. Talk to your care team about your risk of cancer. You may be more at risk for certain types of cancer if you take this medication. Talk to your care team right away if you have a lump or swelling in your neck, hoarseness that does not go away, trouble swallowing, shortness of breath, or trouble breathing. Make sure you stay hydrated while taking this medication. Drink water often. Eat fruits  and veggies that have a high water content. Drink more water when it is hot or you are active. Talk to your care team right away if you have fever, infection, vomiting, diarrhea, or if you sweat a lot while taking this medication. The loss of too much body fluid may make it dangerous for you to take this medication. If you are going to need surgery or a procedure, tell your care team that you are taking this medication. Estrogen and progestin hormones that you take by mouth may not work as well while you are taking this medication. Switch to a non-oral contraceptive or add a barrier contraceptive for 4 weeks after starting this medication and after each dose increase. Talk to your care team about contraceptive options. They can help you find the option that works for you. What side effects may I notice from receiving this medication? Side effects that you should report to your care team as soon as possible: Allergic reactions or angioedema--skin rash, itching or hives, swelling of the face, eyes, lips, tongue, arms, or legs, trouble swallowing or breathing Bowel blockage--stomach cramping, unable to have a bowel movement or pass gas, loss of appetite, vomiting Change in vision Dehydration--increased thirst, dry mouth, feeling faint or lightheaded, headache, dark yellow or brown urine Gallbladder problems--severe stomach pain, nausea, vomiting, fever Kidney injury--decrease in the amount of urine, swelling of the ankles, hands, or feet Pancreatitis--severe stomach pain that spreads to your back or gets worse after eating or when touched, fever, nausea, vomiting Thoughts of suicide or self-harm, worsening mood, feelings of depression Thyroid cancer--new mass or lump in the neck, pain or trouble swallowing, trouble breathing, hoarseness Side effects that usually do not require medical attention (report these to your care team if they continue or are bothersome): Diarrhea Loss of appetite Nausea Upset  stomach This list may not describe all possible side effects. Call your doctor for medical advice about side effects. You may report side effects to FDA at 1-800-FDA-1088. Where should I keep my medication? Keep out of the reach of children and pets. Store in a refrigerator or at room temperature up to 30 degrees C (86 degrees F). Keep it in the original container. Protect from light. Refrigeration (preferred): Store in the refrigerator. Do not freeze. Get rid of any unused medication after the expiration date. Room temperature: This medication may be stored at room temperature for up to 21 days. If it is stored at room temperature, get rid of any unused medication after 21 days or after it expires, whichever is first. To get rid of medications that are no longer needed or have expired: Take the medication to a medication take-back program. Check with your pharmacy or law enforcement to find a location. If you cannot return the medication, ask your pharmacist or care team how to get rid of this medication safely. NOTE: This sheet is a summary. It may not cover all possible information. If you have questions about this medicine, talk to your doctor, pharmacist, or health care provider.  2024 Elsevier/Gold Standard (2023-02-18 00:00:00)

## 2023-08-19 ENCOUNTER — Other Ambulatory Visit: Payer: Self-pay

## 2023-08-19 ENCOUNTER — Other Ambulatory Visit: Payer: Self-pay | Admitting: Pharmacist

## 2023-08-19 ENCOUNTER — Telehealth: Payer: Self-pay

## 2023-08-19 MED ORDER — ZEPBOUND 2.5 MG/0.5ML ~~LOC~~ SOAJ: 2.5000 mg | SUBCUTANEOUS | 1 refills | Status: DC

## 2023-08-19 NOTE — Telephone Encounter (Signed)
 Pharmacy Patient Advocate Encounter   Received notification from CoverMyMeds that prior authorization for ZEPBOUND is required/requested.   Insurance verification completed.   The patient is insured through [facility] .   Per test claim: PA required; PA submitted to above mentioned insurance via CoverMyMeds Key/confirmation #/[id number] [facility] Status is pending

## 2023-08-22 ENCOUNTER — Other Ambulatory Visit: Payer: Self-pay

## 2023-08-22 ENCOUNTER — Encounter: Admitting: Gastroenterology

## 2023-08-26 ENCOUNTER — Ambulatory Visit (INDEPENDENT_AMBULATORY_CARE_PROVIDER_SITE_OTHER)

## 2023-09-01 ENCOUNTER — Other Ambulatory Visit: Payer: Self-pay

## 2023-09-01 ENCOUNTER — Telehealth: Payer: Self-pay

## 2023-09-01 NOTE — Telephone Encounter (Signed)
 In the course of prepping this patient's chart for her PV on 09/16/23 and colonoscopy on 10/07/23, RN observed that patient's most recently recorded BMI as of 08/18/23 was 49.7. RN also observed that patient is currently on Zepbound  for weight loss.  RN contacted patient to inquire about current weight. Patient stated that she thinks she is currently about 321 pounds. RN explained the need to have an accurate reading in order to determine if patient needs to have procedure completed at the hospital instead of [facility].   Patient stated that she will be tracking her weight at home prior to her PV and procedure dates. RN explained that patient will need to have an accurate, current weight for the PV. Patient stated understanding. Patient also stated that she has a follow up appt with her PCP 09/24/23 to assess her weight loss since starting the Zepbound . Patient stated understanding that if BMI increases to 50 or greater, her procedure will need to be rescheduled at the hospital.

## 2023-09-16 ENCOUNTER — Ambulatory Visit (AMBULATORY_SURGERY_CENTER)

## 2023-09-16 VITALS — Ht 68.0 in | Wt 325.0 lb

## 2023-09-16 DIAGNOSIS — Z1211 Encounter for screening for malignant neoplasm of colon: Secondary | ICD-10-CM

## 2023-09-16 MED ORDER — PLENVU 140 G PO SOLR: 1.0000 | Freq: Once | ORAL | 0 refills | Status: DC

## 2023-09-16 MED ORDER — PLENVU 140 G PO SOLR: 1.0000 | Freq: Once | ORAL | 0 refills | Status: AC

## 2023-09-16 NOTE — Progress Notes (Signed)
 No egg or soy allergy known to patient  No issues known to pt with past sedation with any surgeries or procedures Patient denies ever being told they had issues or difficulty with intubation  No FH of Malignant Hyperthermia Pt is not on diet pills Pt is not on  home 02  Pt is not on blood thinners  Pt denies issues with constipation  No A fib or A flutter Have any cardiac testing pending-- no  LOA: independent  Prep: Plenvu   Patient's chart reviewed by Cathlyn Parsons CNRA prior to previsit and patient appropriate for the [facility].  Previsit completed and red dot placed by patient's name on their procedure day (on provider's schedule).     PV completed with patient. Prep instructions sent via mychart and home address.

## 2023-09-16 NOTE — Addendum Note (Signed)
 Addended by: ANN DERRAL CROME on: 09/16/2023 12:51 PM   Modules accepted: Orders

## 2023-09-29 ENCOUNTER — Encounter (INDEPENDENT_AMBULATORY_CARE_PROVIDER_SITE_OTHER): Payer: Self-pay | Admitting: Primary Care

## 2023-09-29 ENCOUNTER — Ambulatory Visit (INDEPENDENT_AMBULATORY_CARE_PROVIDER_SITE_OTHER): Payer: Self-pay | Admitting: Primary Care

## 2023-09-29 VITALS — BP 136/83 | HR 89 | Resp 16 | Wt 324.2 lb

## 2023-09-29 DIAGNOSIS — Z6841 Body Mass Index (BMI) 40.0 and over, adult: Secondary | ICD-10-CM | POA: Diagnosis not present

## 2023-09-29 DIAGNOSIS — Z1211 Encounter for screening for malignant neoplasm of colon: Secondary | ICD-10-CM

## 2023-09-29 DIAGNOSIS — Z7689 Persons encountering health services in other specified circumstances: Secondary | ICD-10-CM

## 2023-09-29 DIAGNOSIS — Z7985 Long-term (current) use of injectable non-insulin antidiabetic drugs: Secondary | ICD-10-CM | POA: Diagnosis not present

## 2023-09-29 DIAGNOSIS — E669 Obesity, unspecified: Secondary | ICD-10-CM

## 2023-09-29 NOTE — Progress Notes (Addendum)
           [facility]   [name], is a 48 y.o. female presents for a follow up after starting on Zepbound  2.5 for weight loss for 1 months.(Note patient is on her menstrual cycle) Patient states they have better variety, more consistent meal timing, better food choices, and decreased sodium intake. While on the Zepbound  they have lost 1 lbs since last visit. They deny Constipation, Diarrhea, Nausea, Upset stomach. She admits to loss of appetite  Hypertension -Patient has No headache, No chest pain, No abdominal pain - No Nausea, No new weakness tingling or numbness, No Cough - shortness of breath  BP Readings from Last 3 Encounters:  09/29/23 136/83  08/18/23 123/88  07/14/23 107/71    Wt Readings from Last 3 Encounters:  09/29/23 (!) 324 lb 3.2 oz (147.1 kg)  09/16/23 (!) 325 lb (147.4 kg)  08/18/23 (!) 327 lb (148.3 kg)   Medications: Current Outpatient Medications on File Prior to Visit  Medication Sig Dispense Refill   ibuprofen  (ADVIL ) 800 MG tablet Take 800 mg by mouth every 8 (eight) hours as needed.     tirzepatide  (ZEPBOUND ) 2.5 MG/0.5ML Pen Inject 2.5 mg into the skin once a week. 2 mL 1   triamcinolone  cream (KENALOG ) 0.1 % Apply 1 Application topically 2 (two) times daily. 453 g 0   No current facility-administered medications on file prior to visit.    ROS:   Denies any headaches, blurred vision, fatigue, shortness of breath, chest pain, abdominal pain, abnormal vaginal discharge/itching/odor/irritation, problems with periods, bowel movements, urination, or intercourse unless otherwise stated above.  Physical exam:  Vitals:   09/29/23 1417  BP: 136/83  Pulse: 89  Resp: 16  SpO2: 97%   Assessment and Plan: [name] was seen today for blood pressure check and weight check.  Diagnoses and all orders for this visit:    Screening for colon cancer GI referral    Encounter for weight management Obesity with co morbid conditions.  General  weight loss/lifestyle modification strategies discussed (elicit support from others; identify saboteurs; non-food rewards, etc). Informal exercise measures discussed, e.g. taking stairs instead of elevator. Regular aerobic exercise program discussed. Follow up in: 55month and as needed.   This note has been created with Education officer, environmental. Any transcriptional errors are unintentional.   Sandra SHAUNNA Bohr, NP 09/29/2023, 2:37 PM

## 2023-09-29 NOTE — Patient Instructions (Signed)
 Type Date User Summary Attachment  Provider Comments 03/03/2023  9:57 PM Venetia Altamese [facility]  Referral -  Note: Placed in [facility] Ph# [phone] [address] [place], [place] [address]

## 2023-10-06 ENCOUNTER — Telehealth: Payer: Self-pay | Admitting: Internal Medicine

## 2023-10-06 DIAGNOSIS — Z1211 Encounter for screening for malignant neoplasm of colon: Secondary | ICD-10-CM

## 2023-10-06 MED ORDER — ONDANSETRON HCL 4 MG PO TABS: 4.0000 mg | ORAL_TABLET | Freq: Three times a day (TID) | ORAL | 0 refills | Status: DC | PRN

## 2023-10-06 NOTE — Telephone Encounter (Signed)
 Inbound call from patient stating she is feeling nauseas. Patient is scheduled for procedure 7/18. Please advise.

## 2023-10-06 NOTE — Telephone Encounter (Signed)
 Pt c/o nausea prior to colonoscopy. Unsure of cause. RN encouraged pt to continue with clear liquids and try some warm broth to see if that may help. Sent prescription for Zofran  to pt's pharmacy. Instructed her to take one table 30-minutes to 1 hour prior to each colonoscopy dose and to call back if she is unable to keep the prep down. Pt verbalized understanding.

## 2023-10-07 ENCOUNTER — Ambulatory Visit: Admitting: Internal Medicine

## 2023-10-07 ENCOUNTER — Encounter: Payer: Self-pay | Admitting: Internal Medicine

## 2023-10-07 VITALS — BP 141/96 | HR 68 | Temp 97.4°F | Resp 25 | Ht 66.75 in | Wt 315.4 lb

## 2023-10-07 DIAGNOSIS — Z1211 Encounter for screening for malignant neoplasm of colon: Secondary | ICD-10-CM

## 2023-10-07 DIAGNOSIS — D123 Benign neoplasm of transverse colon: Secondary | ICD-10-CM | POA: Diagnosis not present

## 2023-10-07 DIAGNOSIS — E669 Obesity, unspecified: Secondary | ICD-10-CM | POA: Diagnosis not present

## 2023-10-07 DIAGNOSIS — K648 Other hemorrhoids: Secondary | ICD-10-CM

## 2023-10-07 DIAGNOSIS — F909 Attention-deficit hyperactivity disorder, unspecified type: Secondary | ICD-10-CM | POA: Diagnosis not present

## 2023-10-07 DIAGNOSIS — F419 Anxiety disorder, unspecified: Secondary | ICD-10-CM | POA: Diagnosis not present

## 2023-10-07 DIAGNOSIS — F32A Depression, unspecified: Secondary | ICD-10-CM | POA: Diagnosis not present

## 2023-10-07 DIAGNOSIS — G4730 Sleep apnea, unspecified: Secondary | ICD-10-CM | POA: Diagnosis not present

## 2023-10-07 DIAGNOSIS — R7303 Prediabetes: Secondary | ICD-10-CM | POA: Diagnosis not present

## 2023-10-07 MED ORDER — SODIUM CHLORIDE 0.9 % IV SOLN: 500.0000 mL | Freq: Once | INTRAVENOUS | Status: DC

## 2023-10-07 NOTE — Patient Instructions (Signed)
 Await pathology Please continue your normal medications Please read over handouts provided   YOU HAD AN ENDOSCOPIC PROCEDURE TODAY AT THE [facility]:   Refer to the procedure report that was given to you for any specific questions about what was found during the examination.  If the procedure report does not answer your questions, please call your gastroenterologist to clarify.  If you requested that your care partner not be given the details of your procedure findings, then the procedure report has been included in a sealed envelope for you to review at your convenience later.  YOU SHOULD EXPECT: Some feelings of bloating in the abdomen. Passage of more gas than usual.  Walking can help get rid of the air that was put into your GI tract during the procedure and reduce the bloating. If you had a lower endoscopy (such as a colonoscopy or flexible sigmoidoscopy) you may notice spotting of blood in your stool or on the toilet paper. If you underwent a bowel prep for your procedure, you may not have a normal bowel movement for a few days.  Please Note:  You might notice some irritation and congestion in your nose or some drainage.  This is from the oxygen used during your procedure.  There is no need for concern and it should clear up in a day or so.  SYMPTOMS TO REPORT IMMEDIATELY:  Following lower endoscopy (colonoscopy or flexible sigmoidoscopy):  Excessive amounts of blood in the stool  Significant tenderness or worsening of abdominal pains  Swelling of the abdomen that is new, acute  Fever of 100F or higher  For urgent or emergent issues, a gastroenterologist can be reached at any hour by calling [phone]. Do not use MyChart messaging for urgent concerns.    DIET:  We do recommend a small meal at first, but then you may proceed to your regular diet.  Drink plenty of fluids but you should avoid alcoholic beverages for 24 hours.  ACTIVITY:  You should plan to take it easy  for the rest of today and you should NOT DRIVE or use heavy machinery until tomorrow (because of the sedation medicines used during the test).    FOLLOW UP: Our staff will call the number listed on your records the next business day following your procedure.  We will call around 7:15- 8:00 am to check on you and address any questions or concerns that you may have regarding the information given to you following your procedure. If we do not reach you, we will leave a message.     If any biopsies were taken you will be contacted by phone or by letter within the next 1-3 weeks.  Please call us  at [phone] if you have not heard about the biopsies in 3 weeks.    SIGNATURES/CONFIDENTIALITY: You and/or your care partner have signed paperwork which will be entered into your electronic medical record.  These signatures attest to the fact that that the information above on your After Visit Summary has been reviewed and is understood.  Full responsibility of the confidentiality of this discharge information lies with you and/or your care-partner.

## 2023-10-07 NOTE — Progress Notes (Signed)
 GASTROENTEROLOGY PROCEDURE H&P NOTE   Primary Care Physician: Celestia Rosaline SQUIBB, NP    Reason for Procedure:   Colon cancer screening  Plan:    Colonoscopy  Patient is appropriate for endoscopic procedure(s) in the ambulatory ([facility]) setting.  The nature of the procedure, as well as the risks, benefits, and alternatives were carefully and thoroughly reviewed with the patient. Ample time for discussion and questions allowed. The patient understood, was satisfied, and agreed to proceed.     HPI: [name] is a 48 y.o. female who presents for colonoscopy for colon cancer screening. Denies blood in stools, changes in bowel habits, or unintentional weight loss. Denies family history of colon cancer.  Past Medical History:  Diagnosis Date   ADD (attention deficit disorder) without hyperactivity    no meds   Anxiety    Depression    no meds   Headache(784.0)    tx w/OTC meds prn   Heartburn    MRSA (methicillin resistant staph aureus) culture positive    Obesity    Pre-diabetes    Sleep apnea    Vitamin D deficiency     Past Surgical History:  Procedure Laterality Date   LAPAROSCOPIC APPENDECTOMY N/A 04/06/2013   Procedure: APPENDECTOMY LAPAROSCOPIC;  Surgeon: Debby LABOR. Cornett, MD;  Location: [facility] OR;  Service: General;  Laterality: N/A;   LAPAROSCOPIC TUBAL LIGATION  06/11/2011   Procedure: LAPAROSCOPIC TUBAL LIGATION;  Surgeon: Olam Mill, MD;  Location: [facility];  Service: Gynecology;  Laterality: N/A;   SVD     x 4 -3 epidurals and 1 spinal w/ SVD   TUBAL LIGATION     WISDOM TOOTH EXTRACTION      Prior to Admission medications   Medication Sig Start Date End Date Taking? Authorizing Provider  ibuprofen  (ADVIL ) 800 MG tablet Take 800 mg by mouth every 8 (eight) hours as needed. 06/16/23  Yes [provider]  ondansetron  (ZOFRAN ) 4 MG tablet Take 1 tablet (4 mg total) by mouth every 8 (eight) hours as needed for nausea or vomiting (take one tablet 30  minutes to 1 hour prior to each colonocopy prep). 10/06/23  Yes Federico Rosario BROCKS, MD  triamcinolone  cream (KENALOG ) 0.1 % Apply 1 Application topically 2 (two) times daily. 03/03/23  Yes Celestia Rosaline SQUIBB, NP  tirzepatide  (ZEPBOUND ) 2.5 MG/0.5ML Pen Inject 2.5 mg into the skin once a week. 08/19/23   Newlin, Enobong, MD    Current Outpatient Medications  Medication Sig Dispense Refill   ibuprofen  (ADVIL ) 800 MG tablet Take 800 mg by mouth every 8 (eight) hours as needed.     ondansetron  (ZOFRAN ) 4 MG tablet Take 1 tablet (4 mg total) by mouth every 8 (eight) hours as needed for nausea or vomiting (take one tablet 30 minutes to 1 hour prior to each colonocopy prep). 6 tablet 0   triamcinolone  cream (KENALOG ) 0.1 % Apply 1 Application topically 2 (two) times daily. 453 g 0   tirzepatide  (ZEPBOUND ) 2.5 MG/0.5ML Pen Inject 2.5 mg into the skin once a week. 2 mL 1   Current Facility-Administered Medications  Medication Dose Route Frequency Provider Last Rate Last Admin   0.9 %  sodium chloride  infusion  500 mL Intravenous Once Cyncere Sontag C, MD        Allergies as of 10/07/2023   (No Known Allergies)    Family History  Problem Relation Age of Onset   Diabetes Mother    Hyperlipidemia Mother    Hypertension Mother  Cirrhosis Mother    Diabetes Father    Cancer Maternal Aunt        thyroid    Cancer Maternal Grandmother        breast   Colon cancer Neg Hx    Rectal cancer Neg Hx    Stomach cancer Neg Hx     Social History   Socioeconomic History   Marital status: Legally Separated    Spouse name: Not on file   Number of children: Not on file   Years of education: Not on file   Highest education level: Not on file  Occupational History   Not on file  Tobacco Use   Smoking status: Every Day    Current packs/day: 0.20    Average packs/day: 0.2 packs/day for 4.0 years (0.8 ttl pk-yrs)    Types: Cigarettes   Smokeless tobacco: Never   Tobacco comments:    3-4 cigarettes at  night only  Vaping Use   Vaping status: Never Used  Substance and Sexual Activity   Alcohol use: Yes    Comment: rarely   Drug use: Not Currently   Sexual activity: Yes    Birth control/protection: Surgical  Other Topics Concern   Not on file  Social History Narrative   Not on file   Social Drivers of Health   Financial Resource Strain: Not on file  Food Insecurity: Food Insecurity Present (05/31/2023)   Hunger Vital Sign    Worried About Running Out of Food in the Last Year: Sometimes true    Ran Out of Food in the Last Year: Sometimes true  Transportation Needs: No Transportation Needs (05/31/2023)   PRAPARE - Administrator, Civil Service (Medical): No    Lack of Transportation (Non-Medical): No  Physical Activity: Not on file  Stress: Not on file  Social Connections: Unknown (05/31/2023)   Social Connection and Isolation Panel    Frequency of Communication with Friends and Family: More than three times a week    Frequency of Social Gatherings with Friends and Family: Three times a week    Attends Religious Services: Not on file    Active Member of Clubs or Organizations: Not on file    Attends Banker Meetings: Not on file    Marital Status: Not on file  Intimate Partner Violence: Not on file    Physical Exam: Vital signs in last 24 hours: BP 123/63 (BP Location: Right Arm, Patient Position: Sitting, Cuff Size: Large)   Pulse 70   Temp (!) 97.4 F (36.3 C) (Temporal)   Ht 5' 6.75 (1.695 m)   Wt (!) 315 lb 6.4 oz (143.1 kg)   LMP 09/29/2023   SpO2 97%   BMI 49.77 kg/m  GEN: NAD EYE: Sclerae anicteric ENT: MMM CV: Non-tachycardic Pulm: No increased work of breathing GI: Soft, NT/ND NEURO:  Alert & Oriented   Estefana Kidney, MD [facility]  10/07/2023 8:39 AM

## 2023-10-07 NOTE — Op Note (Signed)
 [facility] Patient Name: [name] Procedure Date: 10/07/2023 8:41 AM MRN: [id number] Endoscopist: Rosario Estefana Kidney , , [id number] Age: 48 Referring MD:  Date of Birth: 09-Jan-1976 Gender: Female Account #: [id number] Procedure:                Colonoscopy Indications:              Screening for colorectal malignant neoplasm, This                            is the patient's first colonoscopy Medicines:                Monitored Anesthesia Care Procedure:                Pre-Anesthesia Assessment:                           - Prior to the procedure, a History and Physical                            was performed, and patient medications and                            allergies were reviewed. The patient's tolerance of                            previous anesthesia was also reviewed. The risks                            and benefits of the procedure and the sedation                            options and risks were discussed with the patient.                            All questions were answered, and informed consent                            was obtained. Prior Anticoagulants: The patient has                            taken no anticoagulant or antiplatelet agents. ASA                            Grade Assessment: III - A patient with severe                            systemic disease. After reviewing the risks and                            benefits, the patient was deemed in satisfactory                            condition to undergo the procedure.  After obtaining informed consent, the colonoscope                            was passed under direct vision. Throughout the                            procedure, the patient's blood pressure, pulse, and                            oxygen saturations were monitored continuously. The                            Olympus Scope [id number] was introduced through the                            anus and  advanced to the the terminal ileum. The                            colonoscopy was performed without difficulty. The                            patient tolerated the procedure well. The quality                            of the bowel preparation was excellent. The                            terminal ileum, ileocecal valve, appendiceal                            orifice, and rectum were photographed. Scope In: 8:54:13 AM Scope Out: 9:07:31 AM Scope Withdrawal Time: 0 hours 10 minutes 8 seconds  Total Procedure Duration: 0 hours 13 minutes 18 seconds  Findings:                 The terminal ileum appeared normal.                           A 3 mm polyp was found in the transverse colon. The                            polyp was sessile. The polyp was removed with a                            cold snare. Resection and retrieval were complete.                           Non-bleeding internal hemorrhoids were found during                            retroflexion. Complications:            No immediate complications. Estimated Blood Loss:     Estimated blood loss was minimal. Impression:               -  The examined portion of the ileum was normal.                           - One 3 mm polyp in the transverse colon, removed                            with a cold snare. Resected and retrieved.                           - Non-bleeding internal hemorrhoids. Recommendation:           - Discharge patient to home (with escort).                           - Await pathology results.                           - The findings and recommendations were discussed                            with the patient. Dr Estefana Federico Rosario Estefana Federico,  10/07/2023 9:11:41 AM

## 2023-10-07 NOTE — Progress Notes (Signed)
 Called to room to assist during endoscopic procedure.  Patient ID and intended procedure confirmed with present staff. Received instructions for my participation in the procedure from the performing physician.

## 2023-10-07 NOTE — Progress Notes (Signed)
 Vitals-Autumn Pt's states no medical or surgical changes since previsit or office visit. Weight was double checked. CRNA and Dr informed.

## 2023-10-07 NOTE — Progress Notes (Signed)
 Pt sedate, gd SR's, VSS, report to RN

## 2023-10-10 ENCOUNTER — Telehealth: Payer: Self-pay

## 2023-10-10 NOTE — Telephone Encounter (Signed)
  Follow up Call-     10/07/2023    8:28 AM  Call back number  Post procedure Call Back phone  # [phone]  Permission to leave phone message Yes     Patient questions:  Do you have a fever, pain , or abdominal swelling? No. Pain Score  0 *  Have you tolerated food without any problems? Yes.    Have you been able to return to your normal activities? Yes.    Do you have any questions about your discharge instructions: Diet   No. Medications  No. Follow up visit  No.  Do you have questions or concerns about your Care? No.  Actions: * If pain score is 4 or above: No action needed, pain <4.

## 2023-10-11 ENCOUNTER — Ambulatory Visit: Payer: Self-pay | Admitting: Internal Medicine

## 2023-10-11 LAB — SURGICAL PATHOLOGY

## 2023-10-27 ENCOUNTER — Ambulatory Visit (INDEPENDENT_AMBULATORY_CARE_PROVIDER_SITE_OTHER): Admitting: Primary Care

## 2023-10-27 ENCOUNTER — Encounter (INDEPENDENT_AMBULATORY_CARE_PROVIDER_SITE_OTHER): Payer: Self-pay | Admitting: Primary Care

## 2023-10-27 DIAGNOSIS — Z7689 Persons encountering health services in other specified circumstances: Secondary | ICD-10-CM

## 2023-10-27 NOTE — Progress Notes (Signed)
 [facility]   [name], is a 48 y.o. female presents for a follow up after starting on for weight loss for 1 months. Patient states they have . While on the Zebound they have lost 0 lbs since last visit. They deny palpitations, anxiety, trouble sleeping, elevated BP.  She had to hold this month dose due to colonoscopy   BP Readings from Last 3 Encounters:  10/27/23 117/81  10/07/23 (!) 141/96  09/29/23 136/83    Wt Readings from Last 3 Encounters:  10/27/23 (!) 322 lb (146.1 kg)  10/07/23 (!) 315 lb 6.4 oz (143.1 kg)  09/29/23 (!) 324 lb 3.2 oz (147.1 kg)    Medications: Current Outpatient Medications on File Prior to Visit  Medication Sig Dispense Refill   ibuprofen  (ADVIL ) 800 MG tablet Take 800 mg by mouth every 8 (eight) hours as needed.     ondansetron  (ZOFRAN ) 4 MG tablet Take 1 tablet (4 mg total) by mouth every 8 (eight) hours as needed for nausea or vomiting (take one tablet 30 minutes to 1 hour prior to each colonocopy prep). 6 tablet 0   tirzepatide  (ZEPBOUND ) 2.5 MG/0.5ML Pen Inject 2.5 mg into the skin once a week. 2 mL 1   triamcinolone  cream (KENALOG ) 0.1 % Apply 1 Application topically 2 (two) times daily. 453 g 0   No current facility-administered medications on file prior to visit.    ROS:   Denies any headaches, blurred vision, fatigue, shortness of breath, chest pain, abdominal pain, abnormal vaginal discharge/itching/odor/irritation, problems with periods, bowel movements, urination, or intercourse unless otherwise stated above.  Physical exam:  Vitals:   10/27/23 1025  BP: 117/81  Pulse: 88  Resp: 16  Temp: 97.9 F (36.6 C)  SpO2: 99%   BP 117/81 (BP Location: Left Arm, Patient Position: Sitting, Cuff Size: Large)   Pulse 88   Temp 97.9 F (36.6 C) (Oral)   Resp 16   Ht 5' 6.75 (1.695 m)   Wt (!) 322 lb (146.1 kg)   LMP 09/29/2023   SpO2 99%   BMI 50.81 kg/m  General appearance: alert, appears stated age,  and morbidly obese Ears: normal TM's and external ear canals both ears Neck: no adenopathy, no carotid bruit, no JVD, supple, symmetrical, trachea midline, and thyroid  not enlarged, symmetric, no tenderness/mass/nodules Lungs: clear to auscultation bilaterally Heart: regular rate and rhythm, S1, S2 normal, no murmur, click, rub or gallop Abdomen: soft, non-tender; bowel sounds normal; no masses,  no organomegaly Extremities: extremities normal, atraumatic, no cyanosis or edema Skin: Skin color, texture, turgor normal. No rashes or lesions Lymph nodes: Cervical, supraclavicular,  nodes normal. Neurologic: Alert and oriented X 3, normal strength and tone. Normal symmetric reflexes. Normal coordination and gait  Assessment and Plan: [name] was seen today for weight check and dermatitis.  Diagnoses and all orders for this visit:  Morbid obesity (HCC) Discussed diet and exercise for person with BMI >25. Instructed: You must burn more calories than you eat. Losing 5 percent of your body weight should be considered a success. In the longer term, losing more than 15 percent of your body weight and staying at this weight is an extremely good result. However, keep in mind that even losing 5 percent of your body weight leads to important health benefits, so try not to get discouraged if you're not able to lose more than this. Will recheck weight in 3-6 months.   Encounter for  weight management Obesity with co morbid conditions.  General weight loss/lifestyle modification strategies discussed (elicit support from others; identify saboteurs; non-food rewards, etc). Informal exercise measures discussed, e.g. taking stairs instead of elevator. Medication: Zepbound  2.5mg /0.78ml . Follow up in: 2 monthsand as needed.   This note has been created with Education officer, environmental. Any transcriptional errors are unintentional.   Sandra SHAUNNA Bohr, NP 10/27/2023, 10:38 AM

## 2023-10-30 MED ORDER — ZEPBOUND 2.5 MG/0.5ML ~~LOC~~ SOAJ: 2.5000 mg | SUBCUTANEOUS | 1 refills | Status: DC

## 2023-11-29 ENCOUNTER — Telehealth (INDEPENDENT_AMBULATORY_CARE_PROVIDER_SITE_OTHER): Payer: Self-pay | Admitting: Primary Care

## 2023-11-29 NOTE — Telephone Encounter (Unsigned)
 Copied from CRM [id number]. Topic: Clinical - Medication Question >> Nov 29, 2023 12:19 PM [facility] ORN wrote: Reason for CRM: Pt thought her Zepbound  was to be increased this month. She is currently taking 2.5 mg. Please call back to advise if this will be increased.

## 2023-12-01 ENCOUNTER — Ambulatory Visit (INDEPENDENT_AMBULATORY_CARE_PROVIDER_SITE_OTHER): Admitting: Primary Care

## 2023-12-02 ENCOUNTER — Other Ambulatory Visit (INDEPENDENT_AMBULATORY_CARE_PROVIDER_SITE_OTHER): Payer: Self-pay | Admitting: Primary Care

## 2023-12-02 DIAGNOSIS — Z7689 Persons encountering health services in other specified circumstances: Secondary | ICD-10-CM

## 2023-12-02 MED ORDER — TIRZEPATIDE-WEIGHT MANAGEMENT 5 MG/0.5ML ~~LOC~~ SOLN: 5.0000 mg | SUBCUTANEOUS | 1 refills | Status: DC

## 2023-12-02 NOTE — Telephone Encounter (Signed)
 Routing to PCP to advise.

## 2023-12-05 ENCOUNTER — Ambulatory Visit

## 2023-12-05 ENCOUNTER — Ambulatory Visit (INDEPENDENT_AMBULATORY_CARE_PROVIDER_SITE_OTHER)

## 2023-12-05 VITALS — BP 112/62 | HR 92 | Temp 98.3°F | Ht 67.0 in | Wt 320.6 lb

## 2023-12-05 DIAGNOSIS — G4733 Obstructive sleep apnea (adult) (pediatric): Secondary | ICD-10-CM

## 2023-12-05 DIAGNOSIS — F17210 Nicotine dependence, cigarettes, uncomplicated: Secondary | ICD-10-CM

## 2023-12-05 DIAGNOSIS — Z6841 Body Mass Index (BMI) 40.0 and over, adult: Secondary | ICD-10-CM

## 2023-12-05 DIAGNOSIS — R0602 Shortness of breath: Secondary | ICD-10-CM | POA: Diagnosis not present

## 2023-12-05 DIAGNOSIS — Z716 Tobacco abuse counseling: Secondary | ICD-10-CM

## 2023-12-05 MED ORDER — NICOTINE 7 MG/24HR TD PT24: 7.0000 mg | MEDICATED_PATCH | Freq: Every day | TRANSDERMAL | 0 refills | Status: DC

## 2023-12-05 NOTE — Patient Instructions (Signed)
 Home sleep study to r/o OSA.   Lung function test to evaluation breathing issue.  7mg /patch for nicotine  patch. Use as instructed on package insert.

## 2023-12-05 NOTE — Progress Notes (Signed)
 New Patient Pulmonology Office Visit   Subjective:  Patient ID: [name], female    DOB: [birth year]  MRN: [id number]  Referred by: Celestia Rosaline SQUIBB, NP  CC:  Chief Complaint  Patient presents with   Sleep Apnea    Had sleep study done at [facility] around 2013.  No record listed in EPIC.    HPI [name] is a 48 y.o. 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   Other symptoms: sob on Exertion. ET 1/2 mile. No cough, phlegm, F/C, N/V.  Smoking 3 cig/d mostly at night x 20 years. Still smoking. Interested in quitting. Interested in patches.   Mouth breather: mostly.  Preferred sleeping position: prone.   On tirzepatide  for weight loss currently at 2.5mg .   Sleep related Symptoms:  Snoring- y Witnessed apnea- y Gasping/choking- y morning HA/dry mouth- y/y tired on awakening, excessive daytime sleepiness- y Restless legs- n Frequent nightmares, sleep talking +.  ?hitting bed partner long time ago but nothing recently.   Sleep routine:  -Bed: 9 p. Lay in bed relaxing w plan to go to sleep around 10p.  -Nocturnal awakenings: 2 or more. 30 min-1 hr to fall back to sleep.usually opens phone when wakes up.   -Wake: 6.40a. Wakes to alarm.  -Napping:naps to 1 hr. 1-2 day/week.  -perceived sleep time: 8hr.  -sleep hygiene: usually on phone or tv prior to bed.   Habits: -Caffeine: 1 cup as needed.  -Alcohol: occasionally.  -Nicotine :see above.  -Recreational drugs: no  -Exercise: limited.   PRIOR TESTS and IMAGING: PSG/HSAT: 2012 in lab PSG: possibly had some sleep apnea. I do not have report of it.       12/05/2023    2:00 PM  Results of the Epworth flowsheet  Sitting and reading 2  Watching TV 2  Sitting, inactive in a public place (e.g. a theatre or a meeting) 0  As a passenger in a car for an hour without a break 1  Lying down to rest in the afternoon when circumstances permit 2  Sitting and talking to someone 0  Sitting quietly after a lunch without  alcohol 0  In a car, while stopped for a few minutes in traffic 0  Total score 7    Allergies: Patient has no known allergies.  Current Outpatient Medications:    ibuprofen  (ADVIL ) 800 MG tablet, Take 800 mg by mouth every 8 (eight) hours as needed., Disp: , Rfl:    nicotine  (NICODERM CQ ) 7 mg/24hr patch, Place 1 patch (7 mg total) onto the skin daily., Disp: 28 patch, Rfl: 0   ondansetron  (ZOFRAN ) 4 MG tablet, Take 1 tablet (4 mg total) by mouth every 8 (eight) hours as needed for nausea or vomiting (take one tablet 30 minutes to 1 hour prior to each colonocopy prep)., Disp: 6 tablet, Rfl: 0   tirzepatide  5 MG/0.5ML injection vial, Inject 5 mg into the skin once a week., Disp: 0.5 mL, Rfl: 1   triamcinolone  cream (KENALOG ) 0.1 %, Apply 1 Application topically 2 (two) times daily., Disp: 453 g, Rfl: 0 Past Medical History:  Diagnosis Date   ADD (attention deficit disorder) without hyperactivity    no meds   Anxiety    Depression    no meds   Headache(784.0)    tx w/OTC meds prn   Heartburn    MRSA (methicillin resistant staph aureus) culture positive    Obesity    Pre-diabetes    Sleep apnea    Vitamin D  deficiency    Past Surgical History:  Procedure Laterality Date   LAPAROSCOPIC APPENDECTOMY N/A 04/06/2013   Procedure: APPENDECTOMY LAPAROSCOPIC;  Surgeon: Debby LABOR. Cornett, MD;  Location: [facility] OR;  Service: General;  Laterality: N/A;   LAPAROSCOPIC TUBAL LIGATION  06/11/2011   Procedure: LAPAROSCOPIC TUBAL LIGATION;  Surgeon: Olam Mill, MD;  Location: [facility];  Service: Gynecology;  Laterality: N/A;   SVD     x 4 -3 epidurals and 1 spinal w/ SVD   TUBAL LIGATION     WISDOM TOOTH EXTRACTION     Family History  Problem Relation Age of Onset   Diabetes Mother    Hyperlipidemia Mother    Hypertension Mother    Cirrhosis Mother    Diabetes Father    Cancer Maternal Aunt        thyroid    Cancer Maternal Grandmother        breast   Colon cancer Neg Hx    Rectal  cancer Neg Hx    Stomach cancer Neg Hx    Social History   Socioeconomic History   Marital status: Legally Separated    Spouse name: Not on file   Number of children: Not on file   Years of education: Not on file   Highest education level: Not on file  Occupational History   Not on file  Tobacco Use   Smoking status: Every Day    Current packs/day: 0.20    Average packs/day: 0.2 packs/day for 4.0 years (0.8 ttl pk-yrs)    Types: Cigarettes   Smokeless tobacco: Never   Tobacco comments:    3-4 cigarettes at night only  Vaping Use   Vaping status: Never Used  Substance and Sexual Activity   Alcohol use: Yes    Comment: rarely   Drug use: Not Currently   Sexual activity: Yes    Birth control/protection: Surgical  Other Topics Concern   Not on file  Social History Narrative   Not on file   Social Drivers of Health   Financial Resource Strain: Not on file  Food Insecurity: Food Insecurity Present (05/31/2023)   Hunger Vital Sign    Worried About Running Out of Food in the Last Year: Sometimes true    Ran Out of Food in the Last Year: Sometimes true  Transportation Needs: No Transportation Needs (05/31/2023)   PRAPARE - Administrator, Civil Service (Medical): No    Lack of Transportation (Non-Medical): No  Physical Activity: Not on file  Stress: Not on file  Social Connections: Socially Integrated (10/27/2023)   Social Connection and Isolation Panel    Frequency of Communication with Friends and Family: More than three times a week    Frequency of Social Gatherings with Friends and Family: More than three times a week    Attends Religious Services: More than 4 times per year    Active Member of [employer] or Organizations: Yes    Attends Banker Meetings: Not on file    Marital Status: Living with partner  Intimate Partner Violence: Not on file       Objective:  BP 112/62   Pulse 92   Temp 98.3 F (36.8 C) (Oral)   Ht 5' 7 (1.702 m)   Wt (!)  320 lb 9.6 oz (145.4 kg)   SpO2 98%   BMI 50.21 kg/m  BMI Readings from Last 3 Encounters:  12/05/23 50.21 kg/m  10/27/23 50.81 kg/m  10/07/23 49.77 kg/m    Physical  Exam: CONSTITUTIONAL: NAD, morbid obesity NASAL/OROPHARYNX:  Normal mucosa. No septal deviation. No hypertrophy of inferior turbinates. Modified Mallampati score 3.  CV: RRR s1s2 nl, no murmurs  RESP: Clear to auscultation, normal respiratory effort   NEURO: CN II/XII grossly intact PSYCH: Alert & oriented x 3, Euthymic, appropriate affect SKIN: rash on both palms and left forearm  Diagnostic Review:  Last metabolic panel Lab Results  Component Value Date   GLUCOSE 99 12/15/2020   NA 137 12/15/2020   K 3.5 12/15/2020   CL 106 12/15/2020   CO2 24 12/15/2020   BUN 7 12/15/2020   CREATININE 0.96 12/15/2020   GFRNONAA >60 12/15/2020   CALCIUM 8.9 12/15/2020   PROT 7.6 04/27/2013   ALBUMIN 3.4 (L) 04/27/2013   BILITOT 0.5 04/27/2013   ALKPHOS 85 04/27/2013   AST 13 04/27/2013   ALT 13 04/27/2013   ANIONGAP 7 12/15/2020        Assessment & Plan:   Assessment & Plan OSA (obstructive sleep apnea) Home sleep study to evaluate for OSA.  If positive we will consider CPAP.  Physiology of OSA and its effect on heart, mood and blood pressure was discussed. Encounter for smoking cessation counseling Greater than 3 minutes of time was spent on counseling.  Interested in quitting.  Minimal use.  Hence we will try NicoDerm 7 mg/h patch.  Suggested using wooden cig.  Shortness of breath Likely related to obesity.  Will get PFTs. Morbid obesity due to excess calories (HCC) Already on tirzepatide .  Discussed the need to go up on the dose.  She will discuss this with her prescribing provider.  Orders Placed This Encounter  Procedures   Pulmonary function test   Home sleep test    She was counselled about not driving while drowsy which is common side effect of sleep related disorders.   Return for 1 month After  Sleep study, sch after PFTs.   Kielan Dreisbach, MD

## 2023-12-09 ENCOUNTER — Other Ambulatory Visit (INDEPENDENT_AMBULATORY_CARE_PROVIDER_SITE_OTHER): Payer: Self-pay | Admitting: *Deleted

## 2023-12-09 MED ORDER — TIRZEPATIDE-WEIGHT MANAGEMENT 5 MG/0.5ML ~~LOC~~ SOLN: 5.0000 mg | SUBCUTANEOUS | 1 refills | Status: DC

## 2023-12-19 ENCOUNTER — Encounter: Payer: Self-pay | Admitting: Physician Assistant

## 2023-12-19 ENCOUNTER — Ambulatory Visit: Admitting: Physician Assistant

## 2023-12-19 VITALS — BP 115/76 | HR 82 | Ht 67.0 in | Wt 318.0 lb

## 2023-12-19 DIAGNOSIS — R631 Polydipsia: Secondary | ICD-10-CM | POA: Diagnosis not present

## 2023-12-19 DIAGNOSIS — F17210 Nicotine dependence, cigarettes, uncomplicated: Secondary | ICD-10-CM

## 2023-12-19 DIAGNOSIS — Z6841 Body Mass Index (BMI) 40.0 and over, adult: Secondary | ICD-10-CM

## 2023-12-19 DIAGNOSIS — L309 Dermatitis, unspecified: Secondary | ICD-10-CM | POA: Diagnosis not present

## 2023-12-19 DIAGNOSIS — Z1231 Encounter for screening mammogram for malignant neoplasm of breast: Secondary | ICD-10-CM

## 2023-12-19 DIAGNOSIS — Z13220 Encounter for screening for lipoid disorders: Secondary | ICD-10-CM

## 2023-12-19 LAB — POCT GLYCOSYLATED HEMOGLOBIN (HGB A1C): Hemoglobin A1C: 5.2 % (ref 4.0–5.6)

## 2023-12-19 MED ORDER — METHYLPREDNISOLONE 4 MG PO TBPK: ORAL_TABLET | ORAL | 0 refills | Status: DC

## 2023-12-19 MED ORDER — TRIAMCINOLONE ACETONIDE 0.1 % EX CREA: 1.0000 | TOPICAL_CREAM | Freq: Two times a day (BID) | CUTANEOUS | 0 refills | Status: AC

## 2023-12-19 NOTE — Progress Notes (Unsigned)
 Established Patient Office Visit  Subjective   Patient ID: [name], female    DOB: [birth year]  Age: 48 y.o. MRN: [id number]  Chief Complaint  Patient presents with   Medication Refill    Glp 1    Psoriasis    Patient is experiencing a flare up on both hands.    Discussed the use of AI scribe software for clinical note transcription with the patient, who gave verbal consent to proceed.  History of Present Illness   [name] is a 48 year old female who presents with persistent itching and burning in her hands.  She has experienced persistent itching and burning in her hands for approximately three years, with occasional temporary improvement. A history of eczema on her arm resolved, but she suspects her current condition may be psoriasis. Kenalog  cream provides some relief but is not long-lasting. A previous visit to a dermatologist resulted in an oral medication (unsure of what it was)  that worsened her itching. She has not seen a dermatologist in the past two to three months due to scheduling difficulties and an unreceived prescription.  She is currently using tirzepatide , with a recent dose increase from 2.5 mg to 5 mg, and is concerned about insurance coverage due to obesity and sleep apnea. She experiences frequent urination and dry mouth, with a family history of diabetes.    Past Medical History:  Diagnosis Date   ADD (attention deficit disorder) without hyperactivity    no meds   Anxiety    Depression    no meds   Headache(784.0)    tx w/OTC meds prn   Heartburn    MRSA (methicillin resistant staph aureus) culture positive    Obesity    Pre-diabetes    Sleep apnea    Vitamin D deficiency    Social History   Socioeconomic History   Marital status: Legally Separated    Spouse name: Not on file   Number of children: Not on file   Years of education: Not on file   Highest education level: Not on file  Occupational History   Not on file  Tobacco Use    Smoking status: Every Day    Current packs/day: 0.20    Average packs/day: 0.2 packs/day for 4.0 years (0.8 ttl pk-yrs)    Types: Cigarettes   Smokeless tobacco: Never   Tobacco comments:    3-4 cigarettes at night only  Vaping Use   Vaping status: Never Used  Substance and Sexual Activity   Alcohol use: Yes    Comment: rarely   Drug use: Not Currently   Sexual activity: Yes    Birth control/protection: Surgical  Other Topics Concern   Not on file  Social History Narrative   Not on file   Social Drivers of Health   Financial Resource Strain: Not on file  Food Insecurity: Food Insecurity Present (05/31/2023)   Hunger Vital Sign    Worried About Running Out of Food in the Last Year: Sometimes true    Ran Out of Food in the Last Year: Sometimes true  Transportation Needs: No Transportation Needs (05/31/2023)   PRAPARE - Administrator, Civil Service (Medical): No    Lack of Transportation (Non-Medical): No  Physical Activity: Not on file  Stress: Not on file  Social Connections: Socially Integrated (10/27/2023)   Social Connection and Isolation Panel    Frequency of Communication with Friends and Family: More than three times a week  Frequency of Social Gatherings with Friends and Family: More than three times a week    Attends Religious Services: More than 4 times per year    Active Member of [employer] or Organizations: Yes    Attends Engineer, structural: Not on file    Marital Status: Living with partner  Intimate Partner Violence: Not on file   Family History  Problem Relation Age of Onset   Diabetes Mother    Hyperlipidemia Mother    Hypertension Mother    Cirrhosis Mother    Diabetes Father    Cancer Maternal Aunt        thyroid    Cancer Maternal Grandmother        breast   Colon cancer Neg Hx    Rectal cancer Neg Hx    Stomach cancer Neg Hx    No Known Allergies  Review of Systems  Constitutional: Negative.   HENT: Negative.    Eyes:  Negative.   Respiratory:  Negative for shortness of breath.   Cardiovascular:  Negative for chest pain.  Gastrointestinal: Negative.   Genitourinary:  Positive for frequency. Negative for dysuria.  Musculoskeletal: Negative.   Skin:  Positive for itching and rash.  Neurological: Negative.   Endo/Heme/Allergies: Negative.   Psychiatric/Behavioral: Negative.        Objective:     BP 115/76 (BP Location: Left Arm, Patient Position: Sitting, Cuff Size: Large)   Pulse 82   Ht 5' 7 (1.702 m)   Wt (!) 318 lb (144.2 kg)   SpO2 98%   BMI 49.81 kg/m  BP Readings from Last 3 Encounters:  12/19/23 115/76  12/05/23 112/62  10/27/23 117/81   Wt Readings from Last 3 Encounters:  12/19/23 (!) 318 lb (144.2 kg)  12/05/23 (!) 320 lb 9.6 oz (145.4 kg)  10/27/23 (!) 322 lb (146.1 kg)    Physical Exam Vitals and nursing note reviewed.  Constitutional:      Appearance: Normal appearance. She is obese.  HENT:     Head: Normocephalic and atraumatic.     Right Ear: External ear normal.     Left Ear: External ear normal.     Nose: Nose normal.     Mouth/Throat:     Mouth: Mucous membranes are moist.     Pharynx: Oropharynx is clear.  Eyes:     Extraocular Movements: Extraocular movements intact.     Conjunctiva/sclera: Conjunctivae normal.     Pupils: Pupils are equal, round, and reactive to light.  Cardiovascular:     Rate and Rhythm: Normal rate and regular rhythm.     Pulses: Normal pulses.     Heart sounds: Normal heart sounds.  Pulmonary:     Effort: Pulmonary effort is normal.     Breath sounds: Normal breath sounds.  Musculoskeletal:     Cervical back: Normal range of motion and neck supple.  Skin:    Findings: Rash present. Rash is scaling. Rash is not pustular.     Comments: See photos  Neurological:     General: No focal deficit present.     Mental Status: She is alert and oriented to person, place, and time.  Psychiatric:        Mood and Affect: Mood normal.         Behavior: Behavior normal.        Thought Content: Thought content normal.        Judgment: Judgment normal.  Assessment & Plan:   Problem List Items Addressed This Visit   None Visit Diagnoses       Eczema, unspecified type    -  Primary   Relevant Medications   methylPREDNISolone  (MEDROL  DOSEPAK) 4 MG TBPK tablet   triamcinolone  cream (KENALOG ) 0.1 %   Other Relevant Orders   Ambulatory referral to Dermatology     Morbid obesity (HCC)       Relevant Orders   CBC with Differential/Platelet   Comp. Metabolic Panel (12)   TSH     Increased thirst       Relevant Orders   HgB A1c (Completed)     Screening, lipid       Relevant Orders   Lipid panel     Encounter for screening mammogram for malignant neoplasm of breast       Relevant Orders   MM 3D SCREENING MAMMOGRAM BILATERAL BREAST     1. Eczema, unspecified type (Primary) Patient request referral to new dermatology due to difficulty with schedule.  Trial Medrol  Dosepak, Kenalog  cream.  Patient education given on supportive care - Ambulatory referral to Dermatology - methylPREDNISolone  (MEDROL  DOSEPAK) 4 MG TBPK tablet; Use per instructions on package  Dispense: 21 tablet; Refill: 0 - triamcinolone  cream (KENALOG ) 0.1 %; Apply 1 Application topically 2 (two) times daily.  Dispense: 453 g; Refill: 0  2. Increased thirst A1c 5.2 - HgB A1c  3. Morbid obesity (HCC) Fasting labs scheduled, patient will follow-up at [facility] to have these completed - CBC with Differential/Platelet; Future - Comp. Metabolic Panel (12); Future - TSH; Future  4. Encounter for screening mammogram for malignant neoplasm of breast  - MM 3D SCREENING MAMMOGRAM BILATERAL BREAST; Future  5. Screening, lipid  - Lipid panel; Future   I have reviewed the patient's medical history (PMH, PSH, Social History, Family History, Medications, and allergies) , and have been updated if relevant. I spent 30 minutes reviewing chart and  face  to face time with patient.     Return if symptoms worsen or fail to improve.    Kirk RAMAN Mayers, PA-C

## 2023-12-19 NOTE — Patient Instructions (Addendum)
 VISIT SUMMARY:  Today, we discussed the persistent itching and burning in your hands, which you have been experiencing for about three years. We also reviewed your concerns about obesity, sleep apnea, and elevated cholesterol levels. Additionally, we talked about your general health maintenance, including the need for a mammogram.  YOUR PLAN:  -ATOPIC DERMATITIS OF HANDS AND ARMS: Atopic dermatitis is a condition that causes your skin to become itchy and inflamed. We will refer you to a dermatologist for further evaluation and management. In the meantime, I have prescribed a steroid dose pack and a steroid cream to help manage your symptoms.  -OBESITY: Obesity is a condition where you have an excessive amount of body fat, which can affect your overall health. We discussed the importance of managing your weight, especially with your sleep apnea and blood sugar levels. Please call [facility] to confirm the pickup date for your tirzepatide  and contact Medicaid about any coverage changes. We will also check your A1c level.  -ELEVATED CHOLESTEROL: Elevated cholesterol means you have higher levels of cholesterol in your blood, which can increase your risk of heart disease. We will schedule fasting labs to check your cholesterol levels and monitor your health.  -GENERAL HEALTH MAINTENANCE: For your general health maintenance, you are due for a mammogram.   Atopic Dermatitis  "Dermatitis" means inflammation of the skin.  "Atopic" dermatitis is a particular type of skin inflammation that is marked by dryness, associated itching, and a characteristic pattern of rash on the body.  The condition is fairly common and may occur in as many as 10% of children.  You will often hear it called "atopic eczema" or sometimes just "eczema".  The exact cause of atopic dermatitis is unknown.  In many patients, there is a family history of hay fever, asthma, or atopic dermatitis itself.  Rarely, atopic dermatitis in infants  may be related to food sensitivity, such as sensitivity to milk, but this is often difficult to determine and manage.  In the majority of cases, however, no allergic triggers can be found.  Physical or emotional stressors (severe seasonal allergies, physical illness, etc.) can worsen atopic dermatitis.  Atopic dermatitis usually starts in infancy from the ages of 2 to 6 months.  The skin is dry and the rash is quite itchy, so infants may be restless and rub against the sheets or scratch (if able).  The rash may involve the face or it may cover a large part of the body.  As the child gets older, the rash may become more localized.  In early childhood, the rash is commonly on the legs, feet, hands and arms.  As a child becomes older, the rash may be limited to the bend of the elbows, knees, on the back of the hands, feet, and on the neck and face.  When the rash becomes more established, the dry itchy skin may become thickened, leathery and sometimes darker in coloration.  The more the person scratches, the worse the rash is and the thicker the skin gets.  Many children with atopic dermatitis outgrow the condition before school age, while others continue to have problems into adolescence and adulthood.  Many things may affect the severity of the condition.  All patients have sensitive and dry skin.  Many will find that during the winter months when the humidity is very low, the dryness and itchiness will be worse.  On the other hand, some people are easily irritated by sweat and will find that they have more  problems during the summer months.  Most patients note an increase in itching at times when there are sudden changes in temperature.  Other irritants easily affect the skin of a patient with atopic dermatitis.  Use of harsh soaps or detergents and exposure to wool are common problems.  Sometimes atopic dermatitis may become infected by bacteria, yeast or viruses.  This is called "secondary infection".   Bacterial secondary infection is the most common and is often a result of scratching.  The rash gets very red with pus-filled pimples and scabs.  If this occurs, your doctor will prescribe an antibiotic to control the infection.  A more serious complication can be caused by certain viruses.  The "cold sore" virus (herpes simplex) may cause a severe rash.  If this is suspected, immediately contact your doctor.   What can I expect from treatment? Unfortunately, there is no "magic" cure that will always eliminate atopic dermatitis.  The main objective in treating atopic dermatitis is to decrease the skin eruption and relieve the itching.  There are a number of different forms of the medications that are used for atopic dermatitis.  Primarily, topical medications will be used.  Because the skin is excessively dry, moisturizers will be recommended that will effectively decrease the dryness.  Daily bathing is a useful way to get water into the skin but bathing should be brief (no more than 10 minutes unless otherwise indicated by your physician).  Effective moisturizers (Cetaphil cream or lotion, CeraVe cream or lotion [[employer], [facility], and [facility]], Aquaphor, and plain Vaseline) can be used immediately after the bath or shower to trap moisture within the skin.  It is best to "pat dry" after a bathing and then place your moisturizer (cream or lotion) on your skin.  Cortisone (steroid) is a medicated ointment or cream (eg. triamcinolone , hydrocortisone , desonide, betamethasone, clobetasol) that may also be suggested.  It is very helpful in decreasing the itching and controlling the inflammation.  Your doctor will prescribe a cortisone treatment that is most appropriate for the severity and location of the dermatitis that is to be treated.    Once the affected area clears up, it is best to discontinue the use of the cortisone preparation due to possibility of atrophy (skin thinning), but continue the regular use of  moisturizers to try to prevent new areas of dermatitis from occurring.  Of course, if itching or a new rash begins, the cortisone preparation may have to be started again.  Anti-inflammatory creams and ointments which are not steroids such as Protopic and Elidel may also be prescribed.  Certain internal medicines called antihistamines (eg. Atarax, Benadryl , hydroxyzine) may help control itching.  They primarily help with the itching by introducing some drowsiness and allowing you to sleep at night.  Some oral antibiotics are often useful as well for controlling the secondary infection and enable infected dermatitis to be controlled.  Other important forms of treatment: Avoid contact with substances you know to cause itching.  These may include soaps, detergents, certain perfumes, dust, grass, weeds, wools, and other types of scratchy clothing. You may bathe daily.  Use no soap or the minimal amount necessary to get clean.  Always use moisturizer immediately after bathing (within 3 minutes is best).  Avoid very hot or very cold water.  Avoid bubble baths.  When drying with a towel, pat dry and do not rub. Use a mild, unscented soap (Dove, CeraVe Cleanser, Lever 2000, or Cetaphil). Try to keep the temperature and humidity  in the home fairly constant.  Use a bedroom air conditioner in the summer and a humidifier in the winter.  It is very important that the humidifier be cleaned frequently and thoroughly since mold may grow and cause allergies. Try to avoid scratching.  Atopic dermatitis is often called "the itch that rashes" and it is known that scratching plays a significant role in making atopic dermatitis worse.  Keeping the nails short and well-filed is helpful. Use a fragrance-free, sensitive skin laundry detergent (eg. All Free & Clear).  Run clothes through a second rinse cycle to remove any residual detergents and chemicals.  Bed linens and towels should be washed in hot water to kill dust mites,  which are common allergen in atopic patients. In the bedroom, minimize rugs and curtains or other loose fabrics that collect dust.  The National Eczema Association (www.eczema-assn.org) is a wonderful organization that sends out a Dealer with useful information on these types of conditions. Please consider contacting them at the above website or by address: [employer]cz[employer] for [employer] and Education, [address], [address], [place] , [address]

## 2023-12-20 ENCOUNTER — Encounter: Payer: Self-pay | Admitting: Physician Assistant

## 2023-12-21 ENCOUNTER — Ambulatory Visit (INDEPENDENT_AMBULATORY_CARE_PROVIDER_SITE_OTHER): Admitting: Primary Care

## 2023-12-28 ENCOUNTER — Other Ambulatory Visit (INDEPENDENT_AMBULATORY_CARE_PROVIDER_SITE_OTHER): Payer: Self-pay | Admitting: *Deleted

## 2023-12-28 DIAGNOSIS — Z7689 Persons encountering health services in other specified circumstances: Secondary | ICD-10-CM

## 2023-12-30 ENCOUNTER — Encounter

## 2023-12-30 ENCOUNTER — Telehealth: Payer: Self-pay

## 2023-12-30 NOTE — Telephone Encounter (Signed)
 Patient no-showed Home Sleep Test appointment due to forgetting the scheduled date. Appointment has been rescheduled to 01/31/24 at 2:00 PM, as this was the only available date. FYI

## 2024-01-04 ENCOUNTER — Ambulatory Visit

## 2024-01-04 VITALS — BP 104/62 | HR 80 | Temp 97.4°F | Ht 67.0 in | Wt 327.0 lb

## 2024-01-04 DIAGNOSIS — R0602 Shortness of breath: Secondary | ICD-10-CM | POA: Diagnosis not present

## 2024-01-04 DIAGNOSIS — F17210 Nicotine dependence, cigarettes, uncomplicated: Secondary | ICD-10-CM | POA: Diagnosis not present

## 2024-01-04 DIAGNOSIS — Z6841 Body Mass Index (BMI) 40.0 and over, adult: Secondary | ICD-10-CM

## 2024-01-04 DIAGNOSIS — Z716 Tobacco abuse counseling: Secondary | ICD-10-CM | POA: Diagnosis not present

## 2024-01-04 DIAGNOSIS — G4733 Obstructive sleep apnea (adult) (pediatric): Secondary | ICD-10-CM | POA: Diagnosis not present

## 2024-01-04 LAB — PULMONARY FUNCTION TEST
DL/VA % pred: 108 %
DL/VA: 4.59 ml/min/mmHg/L
DLCO unc % pred: 99 %
DLCO unc: 24.17 ml/min/mmHg
FEF 25-75 Post: 3.36 L/s
FEF 25-75 Pre: 3.03 L/s
FEF2575-%Change-Post: 10 %
FEF2575-%Pred-Post: 106 %
FEF2575-%Pred-Pre: 96 %
FEV1-%Change-Post: 5 %
FEV1-%Pred-Post: 105 %
FEV1-%Pred-Pre: 99 %
FEV1-Post: 3.47 L
FEV1-Pre: 3.28 L
FEV1FVC-%Change-Post: 9 %
FEV1FVC-%Pred-Pre: 93 %
FEV6-%Change-Post: 0 %
FEV6-%Pred-Post: 103 %
FEV6-%Pred-Pre: 104 %
FEV6-Post: 4.15 L
FEV6-Pre: 4.19 L
FEV6FVC-%Pred-Post: 102 %
FEV6FVC-%Pred-Pre: 102 %
FVC-%Change-Post: -3 %
FVC-%Pred-Post: 100 %
FVC-%Pred-Pre: 104 %
FVC-Post: 4.15 L
FVC-Pre: 4.32 L
Post FEV1/FVC ratio: 84 %
Post FEV6/FVC ratio: 100 %
Pre FEV1/FVC ratio: 76 %
Pre FEV6/FVC Ratio: 100 %

## 2024-01-04 NOTE — Progress Notes (Signed)
 New Patient Pulmonology Office Visit   Subjective:  Patient ID: [name], female    DOB: [birth year]  MRN: [id number]  Referred by: Theodoro Lakes, MD  CC:  Chief Complaint  Patient presents with   Sleep Apnea   Shortness of Breath    Review PFT from today    HPI [name] is a 48 y.o. 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  Last seen on 12/05/2023: Has exertional dyspnea without any symptoms.  Also smoking and interested in quitting.  Concern for OSA and PFTs were ordered.  In addition was having frequent nightmares but not bothering her.  Still smoking at night 2-3 cig.  Tirezapatide is not approved anymore.   Breathing is similar to last time.   Sleep routine:  -Bed: 9 p. Lay in bed relaxing w plan to go to sleep around 10p.  -Nocturnal awakenings: 2 or more. 30 min-1 hr to fall back to sleep.usually opens phone when wakes up.   -Wake: 6.40a. Wakes to alarm.  -Napping:naps to 1 hr. 1-2 day/week.  -perceived sleep time: 8hr.  -sleep hygiene: usually on phone or tv prior to bed.   PRIOR TESTS and IMAGING: PSG/HSAT: 2012 in lab PSG: possibly had some sleep apnea. I do not have report of it.   PFT: 01/04/24: normal. ERV low. Could not do pleth portion.       12/05/2023    2:00 PM  Results of the Epworth flowsheet  Sitting and reading 2  Watching TV 2  Sitting, inactive in a public place (e.g. a theatre or a meeting) 0  As a passenger in a car for an hour without a break 1  Lying down to rest in the afternoon when circumstances permit 2  Sitting and talking to someone 0  Sitting quietly after a lunch without alcohol 0  In a car, while stopped for a few minutes in traffic 0  Total score 7    Allergies: Patient has no known allergies.  Current Outpatient Medications:    ibuprofen  (ADVIL ) 800 MG tablet, Take 800 mg by mouth every 8 (eight) hours as needed., Disp: , Rfl:    methylPREDNISolone  (MEDROL  DOSEPAK) 4 MG TBPK tablet, Use per instructions on  package, Disp: 21 tablet, Rfl: 0   tirzepatide  5 MG/0.5ML injection vial, Inject 5 mg into the skin once a week., Disp: 2 mL, Rfl: 1   triamcinolone  cream (KENALOG ) 0.1 %, Apply 1 Application topically 2 (two) times daily., Disp: 453 g, Rfl: 0 Past Medical History:  Diagnosis Date   ADD (attention deficit disorder) without hyperactivity    no meds   Anxiety    Depression    no meds   Headache(784.0)    tx w/OTC meds prn   Heartburn    MRSA (methicillin resistant staph aureus) culture positive    Obesity    Pre-diabetes    Sleep apnea    Vitamin D deficiency    Past Surgical History:  Procedure Laterality Date   LAPAROSCOPIC APPENDECTOMY N/A 04/06/2013   Procedure: APPENDECTOMY LAPAROSCOPIC;  Surgeon: Debby LABOR. Cornett, MD;  Location: [facility] OR;  Service: General;  Laterality: N/A;   LAPAROSCOPIC TUBAL LIGATION  06/11/2011   Procedure: LAPAROSCOPIC TUBAL LIGATION;  Surgeon: Olam Mill, MD;  Location: [facility];  Service: Gynecology;  Laterality: N/A;   SVD     x 4 -3 epidurals and 1 spinal w/ SVD   TUBAL LIGATION     WISDOM TOOTH EXTRACTION     Family History  Problem Relation Age of Onset   Diabetes Mother    Hyperlipidemia Mother    Hypertension Mother    Cirrhosis Mother    Diabetes Father    Cancer Maternal Aunt        thyroid    Cancer Maternal Grandmother        breast   Colon cancer Neg Hx    Rectal cancer Neg Hx    Stomach cancer Neg Hx    Social History   Socioeconomic History   Marital status: Legally Separated    Spouse name: Not on file   Number of children: Not on file   Years of education: Not on file   Highest education level: Not on file  Occupational History   Not on file  Tobacco Use   Smoking status: Every Day    Current packs/day: 0.20    Average packs/day: 0.2 packs/day for 4.0 years (0.8 ttl pk-yrs)    Types: Cigarettes   Smokeless tobacco: Never   Tobacco comments:    3-4 cigarettes at night only  Vaping Use   Vaping status: Never  Used  Substance and Sexual Activity   Alcohol use: Yes    Comment: rarely   Drug use: Not Currently   Sexual activity: Yes    Birth control/protection: Surgical  Other Topics Concern   Not on file  Social History Narrative   Not on file   Social Drivers of Health   Financial Resource Strain: Not on file  Food Insecurity: Food Insecurity Present (05/31/2023)   Hunger Vital Sign    Worried About Running Out of Food in the Last Year: Sometimes true    Ran Out of Food in the Last Year: Sometimes true  Transportation Needs: No Transportation Needs (05/31/2023)   PRAPARE - Administrator, Civil Service (Medical): No    Lack of Transportation (Non-Medical): No  Physical Activity: Not on file  Stress: Not on file  Social Connections: Socially Integrated (10/27/2023)   Social Connection and Isolation Panel    Frequency of Communication with Friends and Family: More than three times a week    Frequency of Social Gatherings with Friends and Family: More than three times a week    Attends Religious Services: More than 4 times per year    Active Member of [employer] or Organizations: Yes    Attends Banker Meetings: Not on file    Marital Status: Living with partner  Intimate Partner Violence: Not on file       Objective:  BP 104/62   Pulse 80   Temp (!) 97.4 F (36.3 C) (Oral)   Ht 5' 7 (1.702 m)   Wt (!) 327 lb (148.3 kg)   SpO2 96% Comment: room air  BMI 51.22 kg/m  BMI Readings from Last 3 Encounters:  01/04/24 51.22 kg/m  12/19/23 49.81 kg/m  12/05/23 50.21 kg/m    Physical Exam: CONSTITUTIONAL: NAD, morbid obesity NASAL/OROPHARYNX:  Normal mucosa. No septal deviation. No hypertrophy of inferior turbinates. Modified Mallampati score 3.  CV: RRR s1s2 nl, no murmurs  RESP: Lungs clear to auscultation. NEURO: CN II/XII grossly intact PSYCH: Alert & oriented x 3, Euthymic, appropriate affect SKIN: rash on both palms and left forearm  Diagnostic  Review:  Last metabolic panel Lab Results  Component Value Date   GLUCOSE 99 12/15/2020   NA 137 12/15/2020   K 3.5 12/15/2020   CL 106 12/15/2020   CO2 24 12/15/2020   BUN 7 12/15/2020  CREATININE 0.96 12/15/2020   GFRNONAA >60 12/15/2020   CALCIUM 8.9 12/15/2020   PROT 7.6 04/27/2013   ALBUMIN 3.4 (L) 04/27/2013   BILITOT 0.5 04/27/2013   ALKPHOS 85 04/27/2013   AST 13 04/27/2013   ALT 13 04/27/2013   ANIONGAP 7 12/15/2020        Assessment & Plan:   Assessment & Plan OSA (obstructive sleep apnea) Home sleep study to evaluate for OSA. If positive we will consider CPAP. Physiology of OSA and its effect on heart, mood and blood pressure was discussed.  Still awaiting completion of sleep study.  Shortness of breath Likely related to obesity.  PFTs showing low ERV but otherwise unremarkable. Lung volumes could not be performed.  Encounter for smoking cessation counseling Interested in quitting.  Minimal use. Has not tried patches yet. Suggested using wooden cig.  Call 1800 quit now to help get patches.  Morbid obesity due to excess calories (HCC) Not on tirzepatide  anymore.  Will wait on sleep study results to see if she will qualify for Zepbound .  No orders of the defined types were placed in this encounter.   She was counselled about not driving while drowsy which is common side effect of sleep related disorders.   Return for 1 month After Sleep study.   30 min.   Stephnie Parlier, MD

## 2024-01-04 NOTE — Patient Instructions (Signed)
 Full pft without pleth performed today

## 2024-01-04 NOTE — Progress Notes (Signed)
 Full pft without pleth performed today

## 2024-01-04 NOTE — Patient Instructions (Signed)
 Notification of test results are managed in the following manner: If there are any recommendations or changes to the plan of care discussed in office today, we will contact you and let you know what they are. If you do not hear from us , then your results are normal/expected and you can view them through your MyChart account, or a letter will be sent to you. Thank you again for trusting us  with your care [facility].  Pls call 1800 quit now

## 2024-01-04 NOTE — Assessment & Plan Note (Addendum)
 Home sleep study to evaluate for OSA. If positive we will consider CPAP. Physiology of OSA and its effect on heart, mood and blood pressure was discussed.  Still awaiting completion of sleep study.

## 2024-01-04 NOTE — Addendum Note (Signed)
 Addended by: Jesten Cappuccio M on: 01/04/2024 11:43 AM   Modules accepted: Orders

## 2024-01-04 NOTE — Assessment & Plan Note (Addendum)
 Interested in quitting.  Minimal use. Has not tried patches yet. Suggested using wooden cig.  Call 1800 quit now to help get patches.

## 2024-01-04 NOTE — Assessment & Plan Note (Addendum)
 Not on tirzepatide  anymore.  Will wait on sleep study results to see if she will qualify for Zepbound .

## 2024-01-04 NOTE — Assessment & Plan Note (Addendum)
 Likely related to obesity.  PFTs showing low ERV but otherwise unremarkable. Lung volumes could not be performed.

## 2024-01-09 ENCOUNTER — Encounter (INDEPENDENT_AMBULATORY_CARE_PROVIDER_SITE_OTHER): Payer: Self-pay | Admitting: Primary Care

## 2024-01-09 ENCOUNTER — Ambulatory Visit (INDEPENDENT_AMBULATORY_CARE_PROVIDER_SITE_OTHER): Admitting: Primary Care

## 2024-01-09 VITALS — BP 110/74 | HR 86 | Resp 16 | Wt 321.2 lb

## 2024-01-09 DIAGNOSIS — L308 Other specified dermatitis: Secondary | ICD-10-CM

## 2024-01-09 DIAGNOSIS — L309 Dermatitis, unspecified: Secondary | ICD-10-CM | POA: Diagnosis not present

## 2024-01-09 DIAGNOSIS — R0689 Other abnormalities of breathing: Secondary | ICD-10-CM | POA: Diagnosis not present

## 2024-01-09 MED ORDER — TIRZEPATIDE-WEIGHT MANAGEMENT 5 MG/0.5ML ~~LOC~~ SOLN: 5.0000 mg | SUBCUTANEOUS | 1 refills | Status: DC

## 2024-01-09 NOTE — Progress Notes (Signed)
 [facility]   [name], is a 48 y.o. female severe morbid obese presents for a follow up after starting on Zebound  for weight loss for 2 months. Patient states they have better variety, less frequent dining out, decreased fat intake, increased physical activity, decreased sodium intake, and watch portion sizes/amount of food eaten at one time. While on the Zebound they have lost 15  lbs since starting.  She is also followed by pulmonology and she has hypoventilation syndrome due to weight.  Patient states they deny palpitations, anxiety, trouble sleeping, elevated BP.  Will resend prescription for 0.5 mg weekly.  Eczema /Psoriasis she has intermittent flareups worse with weather change will like to try Otezla by contact the pharmacist first for samples and if it works we will send in a prescription per literature improvement should be within 4 months.  BP Readings from Last 3 Encounters:  01/09/24 110/74  01/04/24 104/62  12/19/23 115/76    Wt Readings from Last 3 Encounters:  01/09/24 (!) 321 lb 3.2 oz (145.7 kg)  01/04/24 (!) 327 lb (148.3 kg)  12/19/23 (!) 318 lb (144.2 kg)    Medications: Current Outpatient Medications on File Prior to Visit  Medication Sig Dispense Refill   ibuprofen  (ADVIL ) 800 MG tablet Take 800 mg by mouth every 8 (eight) hours as needed.     triamcinolone  cream (KENALOG ) 0.1 % Apply 1 Application topically 2 (two) times daily. 453 g 0   methylPREDNISolone  (MEDROL  DOSEPAK) 4 MG TBPK tablet Use per instructions on package (Patient not taking: Reported on 01/09/2024) 21 tablet 0   tirzepatide  5 MG/0.5ML injection vial Inject 5 mg into the skin once a week. (Patient not taking: Reported on 01/09/2024) 2 mL 1   No current facility-administered medications on file prior to visit.    ROS:   Denies any headaches, blurred vision, fatigue, shortness of breath, chest pain, abdominal pain, abnormal vaginal  discharge/itching/odor/irritation, problems with periods, bowel movements, urination, or intercourse unless otherwise stated above.  Physical exam: Vitals:   01/09/24 0925  BP: 110/74  Pulse: 86  Resp: 16  SpO2: 96%  Weight: (!) 321 lb 3.2 oz (145.7 kg)   General: Vital signs reviewed.  Patient is well-developed and well-nourished, morbid obesity  in no acute distress and cooperative with exam.  Head: Normocephalic and atraumatic. Eyes: EOMI, conjunctivae normal, no scleral icterus.  Neck: Supple, trachea midline, normal ROM, no JVD, masses, thyromegaly, or carotid bruit present.  Cardiovascular: RRR, S1 normal, S2 normal, no murmurs, gallops, or rubs. Pulmonary/Chest: Clear to auscultation bilaterally, no wheezes, rales, or rhonchi. Abdominal: Soft, non-tender, non-distended, BS +, no masses, organomegaly, or guarding present.  Musculoskeletal: No joint deformities, erythema, or stiffness, ROM full and nontender. Extremities: No lower extremity edema bilaterally,  pulses symmetric and intact bilaterally. No cyanosis or clubbing. Neurological: A&O x3, Strength is normal and symmetric bilaterally, cranial nerve II-XII are grossly intact, no focal motor deficit, sensory intact to light touch bilaterally.  Skin:  rashes or erythema. Bilateral hands and left arm  Psychiatric: Normal mood and affect. speech and behavior is normal. Cognition and memory are normal.   Vitals:   01/09/24 0925  BP: 110/74  Pulse: 86  Resp: 16  SpO2: 96%   Jesusita was seen today for weight management screening. Diagnoses and all orders for this visit: Assessment and Plan: Obesity with co morbid conditions.  General weight loss/lifestyle modification strategies discussed (elicit support from  others; identify saboteurs; non-food rewards, etc). Diet interventions: qualitative changes (increase low-fat,  high-fiber foods). Medication: Zebound Follow up in:1 month and as needed.  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  Morbid obesity due to excess calories (HCC) Obesity is > 40  indicating an excess in caloric intake or underlining conditions.Has  lead to other co-morbidities.Shortness off breath , poor self  leading to depression. Educated on lifestyle modifications of diet and exercise which may reduce obesity.    Hypoventilation syndrome Secondary to morbid obesity      This note has been created with Education officer, environmental. Any transcriptional errors are unintentional.   Sandra SHAUNNA Bohr, NP 01/09/2024, 9:42 AM

## 2024-01-11 ENCOUNTER — Ambulatory Visit
Admission: RE | Admit: 2024-01-11 | Discharge: 2024-01-11 | Disposition: A | Discharge status: Home / Self Care | Source: Ambulatory Visit | Attending: Physician Assistant | Admitting: Physician Assistant

## 2024-01-11 DIAGNOSIS — Z1231 Encounter for screening mammogram for malignant neoplasm of breast: Secondary | ICD-10-CM

## 2024-01-16 ENCOUNTER — Ambulatory Visit: Payer: Self-pay | Admitting: Physician Assistant

## 2024-01-16 NOTE — Progress Notes (Signed)
 Pt has viewed test results and providers recommendations

## 2024-01-23 ENCOUNTER — Encounter: Payer: Self-pay | Admitting: Radiology

## 2024-01-31 ENCOUNTER — Ambulatory Visit

## 2024-01-31 DIAGNOSIS — G4733 Obstructive sleep apnea (adult) (pediatric): Secondary | ICD-10-CM

## 2024-02-03 DIAGNOSIS — G4739 Other sleep apnea: Secondary | ICD-10-CM | POA: Diagnosis not present

## 2024-02-05 ENCOUNTER — Ambulatory Visit: Payer: Self-pay

## 2024-02-05 NOTE — Telephone Encounter (Signed)
 Date of Study: 01/31/24  Interpretation: Mild OSA with AHI of 13, O2 desaturation 11 min. O2 nadir 79%.   Plan: Initiate auto CPAP at 6-15 cm H2O and provide supplies.   Sammi Fredericks, MD.

## 2024-02-07 NOTE — Progress Notes (Signed)
 I called and spoke to pt. Pt informed of Dr Lavena note and pt stated she would rather try a weight loss options and not CPAP as she has heard to many complaints about CPAP's. Please advise.

## 2024-02-07 NOTE — Telephone Encounter (Signed)
 Copied from CRM [id number]. Topic: Clinical - Medical Advice >> Feb 07, 2024  1:49 PM Delon DASEN wrote: Reason for CRM: Pulmonologist diagnosed with sleep apnea, now should be able to get the injections... also need update on if she is going to send over a prescription for Otezla - has  not had any of the Zepbound  for 2 months and is asking if she can get a refill at the same dose or would she need to start over, please call [phone]

## 2024-02-13 ENCOUNTER — Other Ambulatory Visit (INDEPENDENT_AMBULATORY_CARE_PROVIDER_SITE_OTHER): Payer: Self-pay | Admitting: Primary Care

## 2024-02-13 NOTE — Telephone Encounter (Unsigned)
 Copied from CRM [id number]. Topic: Clinical - Medication Refill >> Feb 13, 2024  1:00 PM Tiffini S wrote: Medication: irzepatide 5 MG/0.5ML injection vial  Has the patient contacted their pharmacy? Yes (Agent: If no, request that the patient contact the pharmacy for the refill. If patient does not wish to contact the pharmacy document the reason why and proceed with request.) (Agent: If yes, when and what did the pharmacy advise?)  This is the patient's preferred pharmacy:  [id number] [place], [place] - [address] AT [facility] [address] [facility] [address] [place] [address] Phone: [phone] Fax: [phone]   Is this the correct pharmacy for this prescription? Yes If no, delete pharmacy and type the correct one.   Has the prescription been filled recently? Yes  Is the patient out of the medication? Yes  Has the patient been seen for an appointment in the last year OR does the patient have an upcoming appointment? Yes  Can we respond through MyChart? Yes  Agent: Please be advised that Rx refills may take up to 3 business days. We ask that you follow-up with your pharmacy.

## 2024-02-13 NOTE — Telephone Encounter (Signed)
 Routing to office

## 2024-02-14 NOTE — Telephone Encounter (Signed)
 Requested medications are due for refill today.  Too soon  Requested medications are on the active medications list.  yes  Last refill. 01/09/2024 2mL 1 rf  Future visit scheduled.   no  Notes to clinic.  Medication not assigned to a protocol. Please review for refill.    Requested Prescriptions  Pending Prescriptions Disp Refills   tirzepatide  5 MG/0.5ML injection vial 2 mL 1    Sig: Inject 5 mg into the skin once a week.     Off-Protocol Failed - 02/14/2024  2:32 PM      Failed - Medication not assigned to a protocol, review manually.      Passed - Valid encounter within last 12 months    Recent Outpatient Visits           1 month ago Psoriasiform dermatitis   [place] [facility] Celestia Rosaline SQUIBB, NP   3 months ago Morbid obesity [place] [facility])   [place] [facility] Celestia Rosaline SQUIBB, NP   4 months ago Encounter for weight management   [place] [facility] Celestia Rosaline SQUIBB, NP   6 months ago Pneumococcal vaccination declined   [place] [facility] Celestia Rosaline SQUIBB, NP   7 months ago Encounter for weight management    [facility] Celestia Rosaline SQUIBB, NP

## 2024-02-15 ENCOUNTER — Ambulatory Visit

## 2024-02-15 VITALS — BP 112/68 | HR 81 | Temp 97.9°F | Ht 67.0 in | Wt 326.8 lb

## 2024-02-15 DIAGNOSIS — Z6841 Body Mass Index (BMI) 40.0 and over, adult: Secondary | ICD-10-CM

## 2024-02-15 DIAGNOSIS — Z23 Encounter for immunization: Secondary | ICD-10-CM | POA: Diagnosis not present

## 2024-02-15 DIAGNOSIS — G4733 Obstructive sleep apnea (adult) (pediatric): Secondary | ICD-10-CM | POA: Diagnosis not present

## 2024-02-15 MED ORDER — ZEPBOUND 2.5 MG/0.5ML ~~LOC~~ SOAJ: 2.5000 mg | SUBCUTANEOUS | 0 refills | Status: AC

## 2024-02-15 NOTE — Assessment & Plan Note (Addendum)
 Patient has moderate sleep apnea related to obesity with BMI >40, posing significant cardiovascular risks. Patient has failed traditional weight loss measures with caloric deficit and consistent exercise of 150 min/week for >/6 months. Patient will be initiated on Zepbound  (tirzepatide ) for weight management. Zepbound  is the only pharmaceutical treatment approved for moderate-to-severe OSA in adults who are overweight (BMI >/27) or obese (BMI >/30). The patient will continue lifestyle modifications, including structured nutrition and physical activity as directed. No other GLP1 therapy will be used simultaneously at this time. The patient does not have any FDA labeled contraindications to this agent, including pregnancy, lactation, hx or family history of medullary thyroid  cancer, or multiple endocrine neoplasia type II. Side effect profile has been reviewed with patient. Aware of red flag symptoms to notify of immediately or seek emergency care, including severe nausea/vomiting, inability to pass bowels or gas, severe abdominal pain/tenderness, jaundice.   Orders:   Amb Ref to Medical Weight Management

## 2024-02-15 NOTE — Assessment & Plan Note (Addendum)
 Moderate OSA.  Orders:   Ambulatory Referral for DME

## 2024-02-15 NOTE — Progress Notes (Signed)
 Pulmonology Office Visit   Subjective:  Patient ID: [name], female    DOB: [birth year]  MRN: [id number]  Referred by: Celestia Rosaline SQUIBB, NP  CC:  Chief Complaint  Patient presents with   Obstructive Sleep Apnea    Hst F/U. Pt states she would prefer to try weight loss medication, as CPAP therapy would be a last resort.    HPI [name] is a 48 y.o. 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   12/05/2023: Has exertional dyspnea without any symptoms.  Also smoking and interested in quitting.  Concern for OSA and PFTs were ordered.  In addition was having frequent nightmares but not bothering her.    Respective notes from provider reviewed as appropriate to gather relevant information for patient care.   Discussed the use of AI scribe software for clinical note transcription with the patient, who gave verbal consent to proceed.  History of Present Illness   [name] is a 48 year old female with mild obstructive sleep apnea who presents for a follow-up of her sleep study.  She was diagnosed with mild obstructive sleep apnea following a sleep study conducted in November 2025. She has not yet tried CPAP therapy due to concerns about discomfort, particularly with a full mask.  She has a history of dental issues, requiring oral surgery to extract several teeth due to decay, which affects the potential use of a dental device as an alternative treatment for her sleep apnea.  She experiences daytime fatigue due to poor sleep quality and sometimes dozes off. She also experiences shortness of breath and feels 'short winded' at times.  She has not used Zepbound  for over two months, last receiving it in September. She previously lost weight with its use, which improved her energy levels and breathing.  She is currently making lifestyle changes, including meal replacements with smoothies for breakfast, increased walking, and trying to eat more vegetables and fruits, but has not seen significant  weight loss.        Sleep routine:  -Bed: 9 p. Lay in bed relaxing w plan to go to sleep around 10p.  -Nocturnal awakenings: 2 or more. 30 min-1 hr to fall back to sleep.usually opens phone when wakes up.   -Wake: 6.40a. Wakes to alarm.  -Napping:naps to 1 hr. 1-2 day/week.  -perceived sleep time: 8hr.  -sleep hygiene: usually on phone or tv prior to bed.    PRIOR TESTS and IMAGING: PSG/HSAT: 2012 in lab PSG: possibly had some sleep apnea. I do not have report of it.    PFT: 01/04/24: normal. ERV low. Could not do pleth portion.   HST 02/03/24: AHI 13, 79% w O2 nadir 93%.      12/05/2023    2:00 PM  Results of the Epworth flowsheet  Sitting and reading 2  Watching TV 2  Sitting, inactive in a public place (e.g. a theatre or a meeting) 0  As a passenger in a car for an hour without a break 1  Lying down to rest in the afternoon when circumstances permit 2  Sitting and talking to someone 0  Sitting quietly after a lunch without alcohol 0  In a car, while stopped for a few minutes in traffic 0  Total score 7    Allergies: Patient has no known allergies.  Current Outpatient Medications:    ibuprofen  (ADVIL ) 800 MG tablet, Take 800 mg by mouth every 8 (eight) hours as needed., Disp: , Rfl:    tirzepatide  (ZEPBOUND )  2.5 MG/0.5ML Pen, Inject 2.5 mg into the skin once a week., Disp: 2 mL, Rfl: 0   triamcinolone  cream (KENALOG ) 0.1 %, Apply 1 Application topically 2 (two) times daily., Disp: 453 g, Rfl: 0 Past Medical History:  Diagnosis Date   ADD (attention deficit disorder) without hyperactivity    no meds   Anxiety    Depression    no meds   Headache(784.0)    tx w/OTC meds prn   Heartburn    MRSA (methicillin resistant staph aureus) culture positive    Obesity    Pre-diabetes    Sleep apnea    Vitamin D deficiency    Past Surgical History:  Procedure Laterality Date   LAPAROSCOPIC APPENDECTOMY N/A 04/06/2013   Procedure: APPENDECTOMY LAPAROSCOPIC;  Surgeon:  Debby LABOR. Cornett, MD;  Location: [facility] OR;  Service: General;  Laterality: N/A;   LAPAROSCOPIC TUBAL LIGATION  06/11/2011   Procedure: LAPAROSCOPIC TUBAL LIGATION;  Surgeon: Olam Mill, MD;  Location: [facility];  Service: Gynecology;  Laterality: N/A;   SVD     x 4 -3 epidurals and 1 spinal w/ SVD   TUBAL LIGATION     WISDOM TOOTH EXTRACTION     Family History  Problem Relation Age of Onset   Diabetes Mother    Hyperlipidemia Mother    Hypertension Mother    Cirrhosis Mother    Diabetes Father    Cancer Maternal Aunt        thyroid    Cancer Maternal Grandmother        breast   Colon cancer Neg Hx    Rectal cancer Neg Hx    Stomach cancer Neg Hx    Social History   Socioeconomic History   Marital status: Legally Separated    Spouse name: Not on file   Number of children: Not on file   Years of education: Not on file   Highest education level: Not on file  Occupational History   Not on file  Tobacco Use   Smoking status: Every Day    Current packs/day: 0.20    Average packs/day: 0.2 packs/day for 4.0 years (0.8 ttl pk-yrs)    Types: Cigarettes   Smokeless tobacco: Never   Tobacco comments:    3-4 cigarettes at night only  Vaping Use   Vaping status: Never Used  Substance and Sexual Activity   Alcohol use: Yes    Comment: rarely   Drug use: Not Currently   Sexual activity: Yes    Birth control/protection: Surgical  Other Topics Concern   Not on file  Social History Narrative   Not on file   Social Drivers of Health   Financial Resource Strain: Not on file  Food Insecurity: Food Insecurity Present (05/31/2023)   Hunger Vital Sign    Worried About Running Out of Food in the Last Year: Sometimes true    Ran Out of Food in the Last Year: Sometimes true  Transportation Needs: No Transportation Needs (05/31/2023)   PRAPARE - Administrator, Civil Service (Medical): No    Lack of Transportation (Non-Medical): No  Physical Activity: Not on file   Stress: Not on file  Social Connections: Socially Integrated (10/27/2023)   Social Connection and Isolation Panel    Frequency of Communication with Friends and Family: More than three times a week    Frequency of Social Gatherings with Friends and Family: More than three times a week    Attends Religious Services: More than 4 times  per year    Active Member of Clubs or Organizations: Yes    Attends Banker Meetings: Not on file    Marital Status: Living with partner  Intimate Partner Violence: Not on file       Objective:  BP 112/68   Pulse 81   Temp 97.9 F (36.6 C)   Ht 5' 7 (1.702 m) Comment: Per pt  Wt (!) 326 lb 12.8 oz (148.2 kg)   SpO2 97% Comment: RA  BMI 51.18 kg/m  BMI Readings from Last 3 Encounters:  02/15/24 51.18 kg/m  01/09/24 50.31 kg/m  01/04/24 51.22 kg/m    Physical Exam: Physical Exam   ENT: Normal mucosa. No hypertrophy of inferior turbinates. Tonsils are normal sized. Modified Mallampati score is normal. PULMONARY: Lungs clear to auscultation bilaterally, no adventitious breath sounds. CARDIOVASCULAR: Regular rate and rhythm, S1 S2 normal, no murmurs. ABDOMEN: Abdomen soft, nontender. Bowel sounds are normal EXTREMITIES: No peripheral edema noted.       Diagnostic Review:  Last metabolic panel Lab Results  Component Value Date   GLUCOSE 99 12/15/2020   NA 137 12/15/2020   K 3.5 12/15/2020   CL 106 12/15/2020   CO2 24 12/15/2020   BUN 7 12/15/2020   CREATININE 0.96 12/15/2020   GFRNONAA >60 12/15/2020   CALCIUM 8.9 12/15/2020   PROT 7.6 04/27/2013   ALBUMIN 3.4 (L) 04/27/2013   BILITOT 0.5 04/27/2013   ALKPHOS 85 04/27/2013   AST 13 04/27/2013   ALT 13 04/27/2013   ANIONGAP 7 12/15/2020         Assessment & Plan:   Assessment & Plan OSA (obstructive sleep apnea) Moderate OSA.  Orders:   Ambulatory Referral for DME  Morbid obesity due to excess calories [facility]) Patient has moderate sleep apnea related to  obesity with BMI >40, posing significant cardiovascular risks. Patient has failed traditional weight loss measures with caloric deficit and consistent exercise of 150 min/week for >/6 months. Patient will be initiated on Zepbound  (tirzepatide ) for weight management. Zepbound  is the only pharmaceutical treatment approved for moderate-to-severe OSA in adults who are overweight (BMI >/27) or obese (BMI >/30). The patient will continue lifestyle modifications, including structured nutrition and physical activity as directed. No other GLP1 therapy will be used simultaneously at this time. The patient does not have any FDA labeled contraindications to this agent, including pregnancy, lactation, hx or family history of medullary thyroid  cancer, or multiple endocrine neoplasia type II. Side effect profile has been reviewed with patient. Aware of red flag symptoms to notify of immediately or seek emergency care, including severe nausea/vomiting, inability to pass bowels or gas, severe abdominal pain/tenderness, jaundice.   Orders:   Amb Ref to Medical Weight Management  Immunization due  Orders:   Flu vaccine trivalent PF, 6mos and older(Flulaval,Afluria,Fluarix,Fluzone)     Assessment and Plan    Obstructive sleep apnea Moderate obstructive sleep apnea diagnosed. She is hesitant about CPAP due to discomfort concerns. Discussed alternative treatments, but dental issues may affect dental device use. Recommended CPAP for potential benefits like improved energy and physical activity. - Ordered CPAP machine with nasal mask.   Morbid obesity due to excess calories Morbid obesity with shortness of breath and fatigue. She desires weight loss and has tried lifestyle changes. Discussed Zepbound  for weight management, but insurance coverage is uncertain. Previously lost weight with Zepbound  but regained it. Discussed weight management program referral. - Referred to weight management program in [place].  - reorder  zepbound .  She was counselled about not driving while drowsy which is common side effect of sleep related disorders.   No follow-ups on file.   I personally spent a total of 25 minutes in the care of the patient today including preparing to see the patient, getting/reviewing separately obtained history, performing a medically appropriate exam/evaluation, counseling and educating, placing orders, documenting clinical information in the EHR, independently interpreting results, and communicating results.   Beryl Balz, MD

## 2024-02-22 ENCOUNTER — Ambulatory Visit: Payer: Self-pay

## 2024-02-22 NOTE — Telephone Encounter (Signed)
 Patient/caller partially completed triage.  Reason for refusal: already evaluated calling to inquire about medication as discussed.  FYI Only or Action Required?: Action required by provider: medication refill request, clinical question for provider, and med samples not received. See med refill protocol for details of two prescription issues.   Patient was last seen in primary care on 01/09/2024 by Celestia [name] SQUIBB, NP.  Called Nurse Triage reporting Psoriasis and New Med Request.  Symptoms began chronic flairs.  Interventions attempted: Other: requesting med.  Symptoms are: gradually worsening.  Triage Disposition: See PCP When Office is Open (Within 3 Days)  Patient/caregiver understands and will follow disposition?: No, wishes to speak with PCP    Copied from CRM #[id number]. Topic: Clinical - Red Word Triage >> Feb 22, 2024  3:36 PM [name] wrote: [facility] that prompted transfer to Nurse Triage: Pt crying saying that she is in pain from her psoriasis itching and burning. She is requesting Otezla. Warm transfer to NT. Reason for Disposition  Prescription request for new medicine (not a refill)  Answer Assessment - Initial Assessment Questions Psoriasis flair burning and itching-declined further triage. Calling for medication not at pharmacy.   Two prescription issues detailed below:  Otezla- Patient calling in upset that she never received samples for medication Otezla that was discussed in office visit on 01/09/24, she has reached out to pcp several times but has not heard back. She is asking for PCP [name] to send prescription for Otezla for psoriasis flair.   Zepbound - Insurance needs diagnosis code before they will cover. Asking for [name] to address this.   Advised patient message would be sent to pcp for review and follow up, she verbalized understanding and said lets see if it will work this time now that I talked with a nurse.  Protocols used: Medication  Refill and Renewal Call-A-AH

## 2024-02-24 NOTE — Telephone Encounter (Signed)
 Reached out to pt on 02/23/24 and made pt aware that we have samples of Otzella now and she can come by and pick up a sample. Pt states she will come by either 02/24/24 which is today or Monday 02/27/24

## 2024-03-02 ENCOUNTER — Ambulatory Visit: Payer: Self-pay

## 2024-03-02 ENCOUNTER — Telehealth (INDEPENDENT_AMBULATORY_CARE_PROVIDER_SITE_OTHER): Payer: Self-pay

## 2024-03-02 ENCOUNTER — Telehealth: Payer: Self-pay

## 2024-03-02 NOTE — Telephone Encounter (Signed)
 Pharmacy Patient Advocate Encounter  Received notification from [facility] that Prior Authorization for Zepbound   has been CANCELLED due to   Patient is not eligible for Zepbound  therapy due to sleep study showing MILD OSA.

## 2024-03-02 NOTE — Telephone Encounter (Signed)
°  Transfer of care appointment scheduled  Copied from CRM [id number]. Topic: Clinical - Red Word Triage >> Mar 02, 2024  3:43 PM [name] wrote: Red Word that prompted transfer to Nurse Triage: Patient states the psoriasis of her hands and left arm are worse. Is experiencing pain and a burning sensation.  Wants to transfer care to [facility] or [facility] Reason for Disposition  Requesting regular office appointment  Answer Assessment - Initial Assessment Questions Patient called to schedule transfer of care appointment.  Appointment scheduled  Protocols used: Information Only Call - No Triage-A-AH

## 2024-03-02 NOTE — Telephone Encounter (Signed)
 Copied from CRM #[id number]. Topic: General - Other >> Mar 02, 2024  3:44 PM Willma R wrote: Reason for CRM: Patient's dentist sent a form over for clearance for patient to get some dental work done, was dent on 02/28/24. Is looking for an update on when it will be sent back to the dentist.  Patient can be reached at [phone]

## 2024-03-02 NOTE — Telephone Encounter (Signed)
 Please reach out to pt and schedule an acute visit for clearance

## 2024-03-05 NOTE — Telephone Encounter (Signed)
 Called pt to sch but no answer

## 2024-03-14 ENCOUNTER — Ambulatory Visit (INDEPENDENT_AMBULATORY_CARE_PROVIDER_SITE_OTHER): Admitting: Adult Health

## 2024-03-14 ENCOUNTER — Encounter (INDEPENDENT_AMBULATORY_CARE_PROVIDER_SITE_OTHER): Payer: Self-pay | Admitting: Adult Health

## 2024-03-14 DIAGNOSIS — E559 Vitamin D deficiency, unspecified: Secondary | ICD-10-CM

## 2024-03-14 DIAGNOSIS — Z0000 Encounter for general adult medical examination without abnormal findings: Secondary | ICD-10-CM

## 2024-03-14 DIAGNOSIS — Z6841 Body Mass Index (BMI) 40.0 and over, adult: Secondary | ICD-10-CM

## 2024-03-14 DIAGNOSIS — G4733 Obstructive sleep apnea (adult) (pediatric): Secondary | ICD-10-CM

## 2024-03-14 DIAGNOSIS — L309 Dermatitis, unspecified: Secondary | ICD-10-CM

## 2024-03-14 NOTE — Progress Notes (Signed)
 " Office: [phone]  /  Fax: [phone]   Initial Visit    [name] was seen in clinic today to evaluate for obesity. She is interested in losing weight to improve overall health and reduce the risk of weight related complications. She presents today to review program treatment options, initial physical assessment, and evaluation.     She was referred by: Specialist  When asked what else they would like to accomplish? She states: Adopt a healthier eating pattern and lifestyle, Improve energy levels and physical activity, Improve existing medical conditions, and Improve quality of life  When asked how has your weight affected you? She states: Contributed to medical problems, Contributed to orthopedic problems or mobility issues, Having fatigue, and Having poor endurance  Weight history: Elevated BMI since middle school  Highest weight: 335 lbs  Some associated conditions: OSA  Contributing factors: family history of obesity, disruption of circadian rhythm / sleep disordered breathing, consumption of processed foods, moderate to high levels of stress, hectic pace of life, and need for convenient foods  Weight promoting medications identified: None  Prior weight loss attempts: Weight Watchers, Low Carb, Balanced Plate / Portion Control, and High Protein  Current nutrition plan: Portion control / smart choices  Current level of physical activity: Walking 20 minutes, three a week and Other: Dancing at home  Current or previous pharmacotherapy: GLP-1 and GLP-1 + GIP  Response to medication: Was cost prohibitive or lost coverage for AOM   Past medical history includes:   Past Medical History:  Diagnosis Date   ADD (attention deficit disorder) without hyperactivity    no meds   Anxiety    Depression    no meds   Headache(784.0)    tx w/OTC meds prn   Heartburn    MRSA (methicillin resistant staph aureus) culture positive    Obesity    Pre-diabetes    Sleep apnea     Vitamin D deficiency      Objective    BP 116/71   Pulse 71   Temp 98.3 F (36.8 C)   Ht 5' 7.5 (1.715 m)   Wt (!) 321 lb (145.6 kg)   SpO2 98%   BMI 49.53 kg/m  She was weighed on the bioimpedance scale: Body mass index is 49.53 kg/m.  Body Fat%:54.2, Visceral Fat Rating:19, Weight trend over the last 12 months: Unchanged  General:  Alert, oriented and cooperative. Patient is in no acute distress.  Respiratory: Normal respiratory effort, no problems with respiration noted   Gait: able to ambulate independently  Mental Status: Normal mood and affect. Normal behavior. Normal judgment and thought content.   DIAGNOSTIC DATA REVIEWED:  BMET    Component Value Date/Time   NA 137 12/15/2020 0721   K 3.5 12/15/2020 0721   CL 106 12/15/2020 0721   CO2 24 12/15/2020 0721   GLUCOSE 99 12/15/2020 0721   BUN 7 12/15/2020 0721   CREATININE 0.96 12/15/2020 0721   CALCIUM 8.9 12/15/2020 0721   GFRNONAA >60 12/15/2020 0721   GFRAA >90 04/27/2013 2241   Lab Results  Component Value Date   HGBA1C 5.2 12/19/2023   No results found for: INSULIN CBC    Component Value Date/Time   WBC 7.6 12/15/2020 0721   RBC 4.40 12/15/2020 0721   HGB 13.3 12/15/2020 0721   HCT 39.7 12/15/2020 0721   PLT 247 12/15/2020 0721   MCV 90.2 12/15/2020 0721   MCH 30.2 12/15/2020 0721   MCHC 33.5 12/15/2020 0721  RDW 12.4 12/15/2020 0721   Iron/TIBC/Ferritin/ %Sat No results found for: IRON, TIBC, FERRITIN, IRONPCTSAT Lipid Panel  No results found for: CHOL, TRIG, HDL, CHOLHDL, VLDL, LDLCALC, LDLDIRECT Hepatic Function Panel     Component Value Date/Time   PROT 7.6 04/27/2013 2241   ALBUMIN 3.4 (L) 04/27/2013 2241   AST 13 04/27/2013 2241   ALT 13 04/27/2013 2241   ALKPHOS 85 04/27/2013 2241   BILITOT 0.5 04/27/2013 2241   No results found for: TSH   Assessment and Plan   Morbid obesity (HCC), STARTING BMI 49.6  OSA (obstructive sleep apnea)  Healthcare  maintenance  Eczema, unspecified type  Vitamin D deficiency    Assessment and Plan                 Obesity Treatment / Action Plan:  Patient will work on garnering support from family and friends to begin weight loss journey. Will work on eliminating or reducing the presence of highly palatable, calorie dense foods in the home. Will complete provided nutritional and psychosocial assessment questionnaire before the next appointment. Will be scheduled for indirect calorimetry to determine resting energy expenditure in a fasting state.  This will allow us  to create a reduced calorie, high-protein meal plan to promote loss of fat mass while preserving muscle mass. Counseled on the health benefits of losing 5%-15% of total body weight. Was counseled on nutritional approaches to weight loss and benefits of reducing processed foods and consuming plant-based foods and high quality protein as part of nutritional weight management. Was counseled on pharmacotherapy and role as an adjunct in weight management.   Obesity Education Performed Today:  She was weighed on the bioimpedance scale and results were discussed and documented in the synopsis.  We discussed obesity as a disease and the importance of a more detailed evaluation of all the factors contributing to the disease.  We discussed the importance of long term lifestyle changes which include nutrition, exercise and behavioral modifications as well as the importance of customizing this to her specific health and social needs.  We discussed the benefits of reaching a healthier weight to alleviate the symptoms of existing conditions and reduce the risks of the biomechanical, metabolic and psychological effects of obesity.  We reviewed the four pillars of obesity medicine and importance of using a multimodal approach.  We reviewed the basic principles in weight management.   [name] appears to be in the action stage of change and  states they are ready to start intensive lifestyle modifications and behavioral modifications.  I have spent 26 minutes in the care of the patient today including: 3 minutes before the visit reviewing and preparing the chart. 20 minutes face-to-face assessing and reviewing listed medical problems as outlined in obesity care plan, providing nutritional and behavioral counseling on topics outlined in the obesity care plan, counseling regarding anti-obesity medication as outlined in obesity care plan, independently interpreting test results and goals of care, as described in assessment and plan, and reviewing and discussing biometric information and progress 3 minutes after the visit updating chart and documentation of encounter.  Reviewed by clinician on day of visit: allergies, medications, problem list, medical history, surgical history, family history, social history, and previous encounter notes pertinent to obesity diagnosis.  Lorita Forinash d. Istvan Behar, NP-C   "

## 2024-04-27 NOTE — Progress Notes (Signed)
 Pt was seen on 02-15-24 by Dr Theodoro. NFN

## 2024-05-02 ENCOUNTER — Ambulatory Visit (INDEPENDENT_AMBULATORY_CARE_PROVIDER_SITE_OTHER): Payer: Self-pay | Admitting: Primary Care

## 2024-05-16 ENCOUNTER — Encounter: Payer: Self-pay | Admitting: Family Medicine
